# Patient Record
Sex: Female | Born: 1935 | Race: White | Hispanic: No | Marital: Married | Smoking: Never smoker
Health system: Southern US, Community
[De-identification: ages and names within clinical notes are randomized; demographics above are authoritative.]

## PROBLEM LIST (undated history)

## (undated) DIAGNOSIS — G8929 Other chronic pain: Secondary | ICD-10-CM

## (undated) DIAGNOSIS — M5030 Other cervical disc degeneration, unspecified cervical region: Secondary | ICD-10-CM

## (undated) DIAGNOSIS — K219 Gastro-esophageal reflux disease without esophagitis: Secondary | ICD-10-CM

## (undated) DIAGNOSIS — M1990 Unspecified osteoarthritis, unspecified site: Secondary | ICD-10-CM

## (undated) DIAGNOSIS — E785 Hyperlipidemia, unspecified: Secondary | ICD-10-CM

## (undated) DIAGNOSIS — M549 Dorsalgia, unspecified: Secondary | ICD-10-CM

## (undated) DIAGNOSIS — J189 Pneumonia, unspecified organism: Secondary | ICD-10-CM

## (undated) DIAGNOSIS — I499 Cardiac arrhythmia, unspecified: Secondary | ICD-10-CM

## (undated) HISTORY — DX: Hyperlipidemia, unspecified: E78.5

## (undated) HISTORY — DX: Other chronic pain: G89.29

## (undated) HISTORY — PX: TOTAL ABDOMINAL HYSTERECTOMY: SHX209

## (undated) HISTORY — DX: Gastro-esophageal reflux disease without esophagitis: K21.9

## (undated) HISTORY — PX: OTHER SURGICAL HISTORY: SHX169

## (undated) HISTORY — DX: Unspecified osteoarthritis, unspecified site: M19.90

## (undated) HISTORY — PX: TONSILLECTOMY: SUR1361

## (undated) HISTORY — DX: Dorsalgia, unspecified: M54.9

## (undated) HISTORY — DX: Other cervical disc degeneration, unspecified cervical region: M50.30

---
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
## 1997-06-26 ENCOUNTER — Other Ambulatory Visit: Admission: RE | Admit: 1997-06-26 | Discharge: 1997-06-26 | Payer: Self-pay | Admitting: *Deleted

## 1998-08-09 ENCOUNTER — Other Ambulatory Visit: Admission: RE | Admit: 1998-08-09 | Discharge: 1998-08-09 | Payer: Self-pay | Admitting: *Deleted

## 1999-04-06 ENCOUNTER — Encounter: Payer: Self-pay | Admitting: Orthopedic Surgery

## 1999-04-06 ENCOUNTER — Encounter: Admission: RE | Admit: 1999-04-06 | Discharge: 1999-04-06 | Payer: Self-pay | Admitting: Orthopedic Surgery

## 1999-09-15 ENCOUNTER — Other Ambulatory Visit: Admission: RE | Admit: 1999-09-15 | Discharge: 1999-09-15 | Payer: Self-pay | Admitting: *Deleted

## 2000-09-14 ENCOUNTER — Ambulatory Visit (HOSPITAL_COMMUNITY): Admission: RE | Admit: 2000-09-14 | Discharge: 2000-09-14 | Payer: Self-pay | Admitting: Gastroenterology

## 2000-12-17 ENCOUNTER — Other Ambulatory Visit: Admission: RE | Admit: 2000-12-17 | Discharge: 2000-12-17 | Payer: Self-pay | Admitting: *Deleted

## 2002-08-21 ENCOUNTER — Encounter: Admission: RE | Admit: 2002-08-21 | Discharge: 2002-08-21 | Payer: Self-pay

## 2003-05-25 ENCOUNTER — Encounter: Admission: RE | Admit: 2003-05-25 | Discharge: 2003-05-25 | Payer: Self-pay | Admitting: Orthopedic Surgery

## 2005-01-23 HISTORY — PX: OTHER SURGICAL HISTORY: SHX169

## 2006-08-27 ENCOUNTER — Encounter: Admission: RE | Admit: 2006-08-27 | Discharge: 2006-08-27 | Payer: Self-pay | Admitting: Orthopedic Surgery

## 2006-09-11 ENCOUNTER — Encounter: Admission: RE | Admit: 2006-09-11 | Discharge: 2006-09-11 | Payer: Self-pay | Admitting: Orthopedic Surgery

## 2007-08-27 ENCOUNTER — Encounter: Admission: RE | Admit: 2007-08-27 | Discharge: 2007-08-27 | Payer: Self-pay | Admitting: Orthopedic Surgery

## 2007-09-10 ENCOUNTER — Encounter: Admission: RE | Admit: 2007-09-10 | Discharge: 2007-09-10 | Payer: Self-pay | Admitting: Orthopedic Surgery

## 2009-04-29 ENCOUNTER — Encounter: Admission: RE | Admit: 2009-04-29 | Discharge: 2009-04-29 | Payer: Self-pay | Admitting: Internal Medicine

## 2009-07-06 ENCOUNTER — Encounter: Admission: RE | Admit: 2009-07-06 | Discharge: 2009-07-06 | Payer: Self-pay | Admitting: Orthopedic Surgery

## 2009-08-10 ENCOUNTER — Encounter: Admission: RE | Admit: 2009-08-10 | Discharge: 2009-08-10 | Payer: Self-pay | Admitting: Orthopedic Surgery

## 2010-02-14 ENCOUNTER — Encounter: Payer: Self-pay | Admitting: Orthopedic Surgery

## 2010-06-10 NOTE — Procedures (Signed)
[facility]  Patient:    Ana Lopez, Ana Lopez Visit Number: [id number] MRN: [id number]          Service Type: END Location: ENDO Attending Physician:  Louie Bun Proc. Date: 09/14/00 Adm. Date:  09/14/2000   CC:         Heather Roberts, M.D.   Procedure Report  PROCEDURE:  Colonoscopy.  INDICATION FOR PROCEDURE:  Screening colonoscopy in a 77 year old patient with no prior colon screening.  DESCRIPTION OF PROCEDURE:  The patient was placed in the left lateral decubitus position and placed on the pulse monitor with continuous low-flow oxygen delivered by nasal cannula.  She was sedated with 40 mg IV Demerol and 4 mg IV Versed in addition to the 60 mg of Demerol and 6 mg of Versed given for the previous EGD.  The Olympus video colonoscope was inserted into the rectum and advanced to the cecum, confirmed by transillumination at McBurneys point and visualization of the ileocecal valve and appendiceal orifice.  The prep was excellent.  The cecum, ascending, transverse, descending, and sigmoid colon all appeared normal with no masses, polyps, diverticula, or other mucosal abnormalities.  The rectum likewise appeared normal, and retroflex view of the anus revealed some small internal hemorrhoids.  There were also a few rare scattered diverticula in the sigmoid colon.  The colonoscope was then withdrawn, and the patient returned to the recovery room in stable condition. She tolerated the procedure well, and there were no immediate complications.  IMPRESSION:  Internal hemorrhoids, otherwise normal colonoscopy. Attending Physician:  Louie Bun DD:  09/14/00 TD:  09/15/00 Job: [id number]

## 2010-06-10 NOTE — Procedures (Signed)
[facility]  Patient:    Ana Lopez, Ana Lopez Visit Number: [id number] MRN: [id number]          Service Type: END Location: ENDO Attending Physician:  Louie Bun Proc. Date: 09/14/00 Adm. Date:  09/14/2000   CC:         Heather Roberts, M.D.   Procedure Report  PROCEDURE:  Esophagogastroduodenoscopy.  INDICATION FOR PROCEDURE:  Patient undergoing screening colonoscopy who also has a family history of stomach cancer in a first-degree relative.  DESCRIPTION OF PROCEDURE:  The patient was placed in the left lateral decubitus position and placed on the pulse monitor with continuous low-flow oxygen delivered by nasal cannula.  She was sedated with 40 mg IV Demerol and 4 mg IV Versed.   The Olympus video endoscope was advanced under direct vision into the oropharynx and esophagus.  The esophagus was straight and of normal caliber with the squamocolumnar line at 38 cm.  There was no hiatal hernia, ring, stricture, or other abnormality of the GE junction.  The stomach was entered, and a small amount of liquid secretions were suctioned from the fundus.  Retroflexed view of the cardia was unremarkable.  The fundus, body, antrum, and pylorus all appeared normal.  The duodenum was entered, and both the bulb and second portion were well-inspected and appeared to be within normal limits.  The scope was then withdrawn, and the patient returned to the recovery room in stable condition.  She tolerated the procedure well, and there were no immediate complications.  IMPRESSION:  Essentially normal endoscopy.  PLAN:  Will proceed with colonoscopy as scheduled. Attending Physician:  Louie Bun DD:  09/14/00 TD:  09/15/00 Job: [id number]

## 2012-09-18 ENCOUNTER — Ambulatory Visit
Admission: RE | Admit: 2012-09-18 | Discharge: 2012-09-18 | Disposition: A | Discharge status: Home / Self Care | Payer: Medicare Other | Source: Ambulatory Visit | Attending: Internal Medicine | Admitting: Internal Medicine

## 2012-09-18 ENCOUNTER — Other Ambulatory Visit: Payer: Self-pay | Admitting: Internal Medicine

## 2012-09-18 DIAGNOSIS — M79602 Pain in left arm: Secondary | ICD-10-CM

## 2012-09-18 DIAGNOSIS — R52 Pain, unspecified: Secondary | ICD-10-CM

## 2012-09-18 IMAGING — CR DG HUMERUS 2V *L*
2 series · 2 of 2 positions shown · non-contrast
Comparison: None

CLINICAL DATA: Posterior pain at left shoulder and humerus
radiating to left elbow for 1 month, no injury, tenderness

LEFT HUMERUS - 2+ VIEW

[view not recorded (1 of 2)]
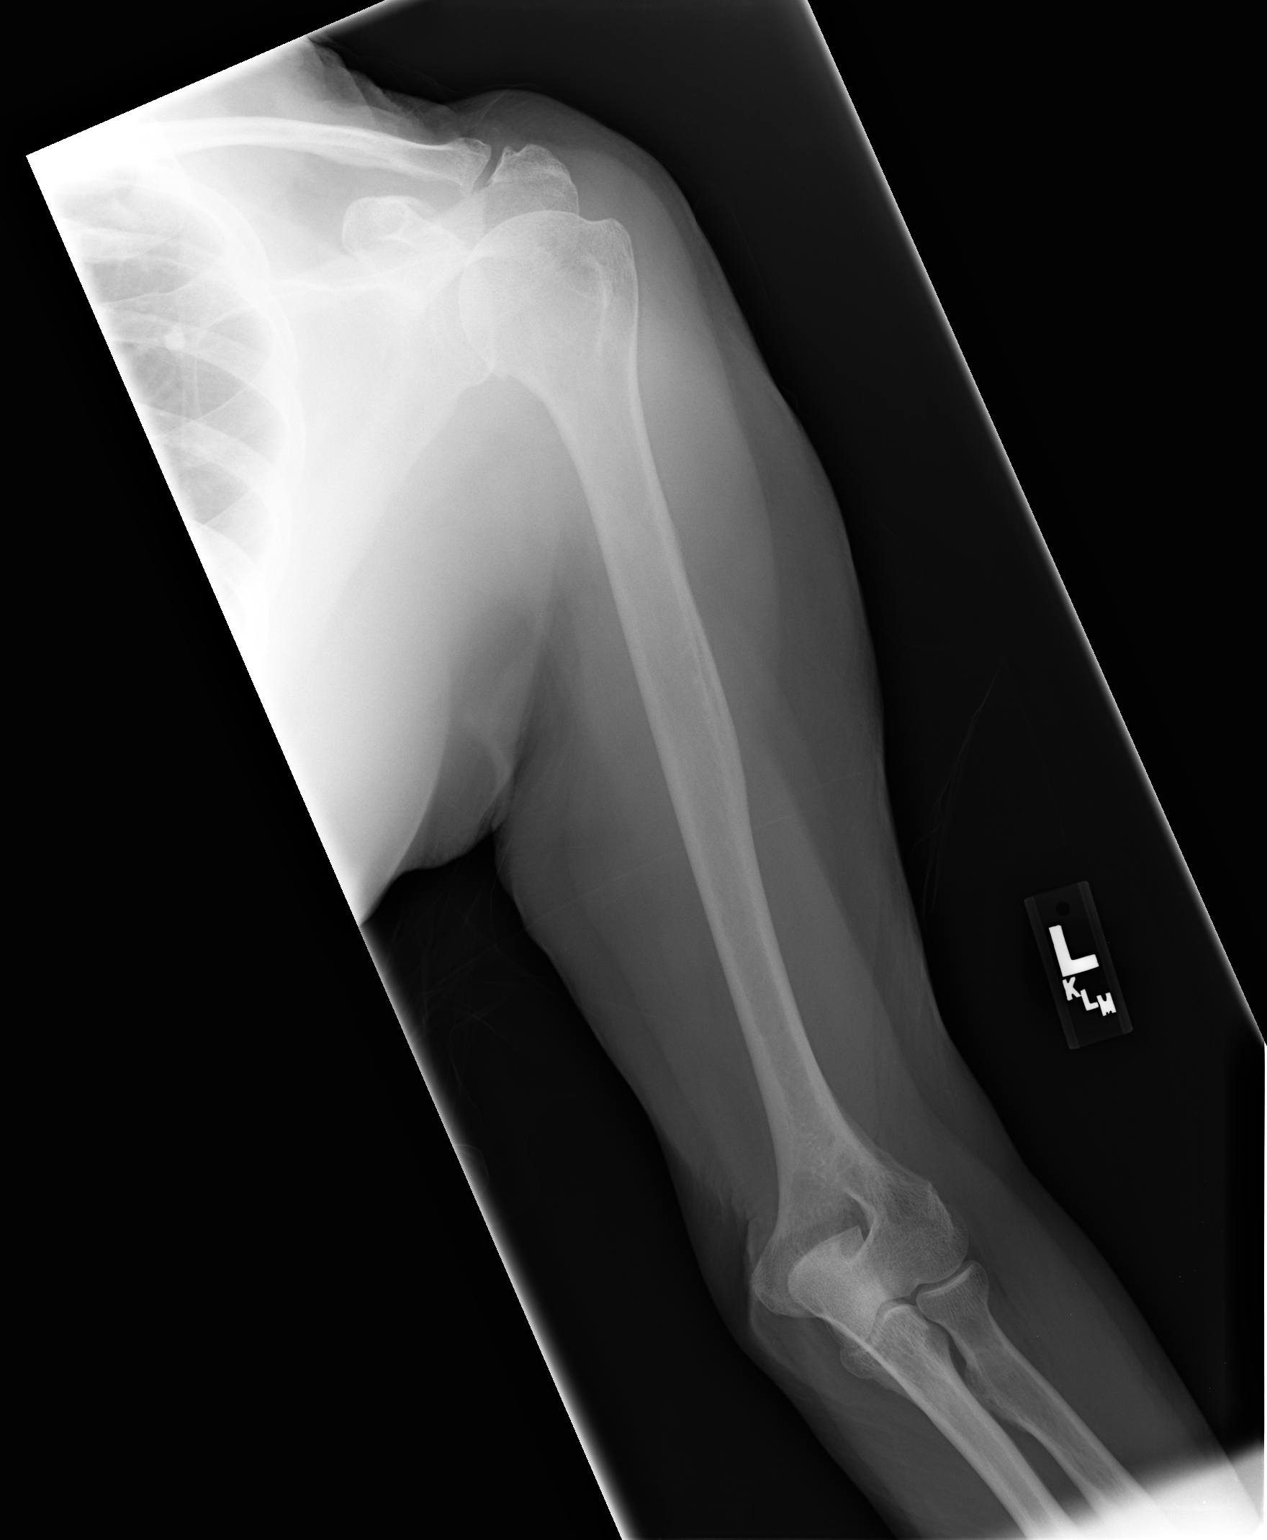

[view not recorded (2 of 2)]
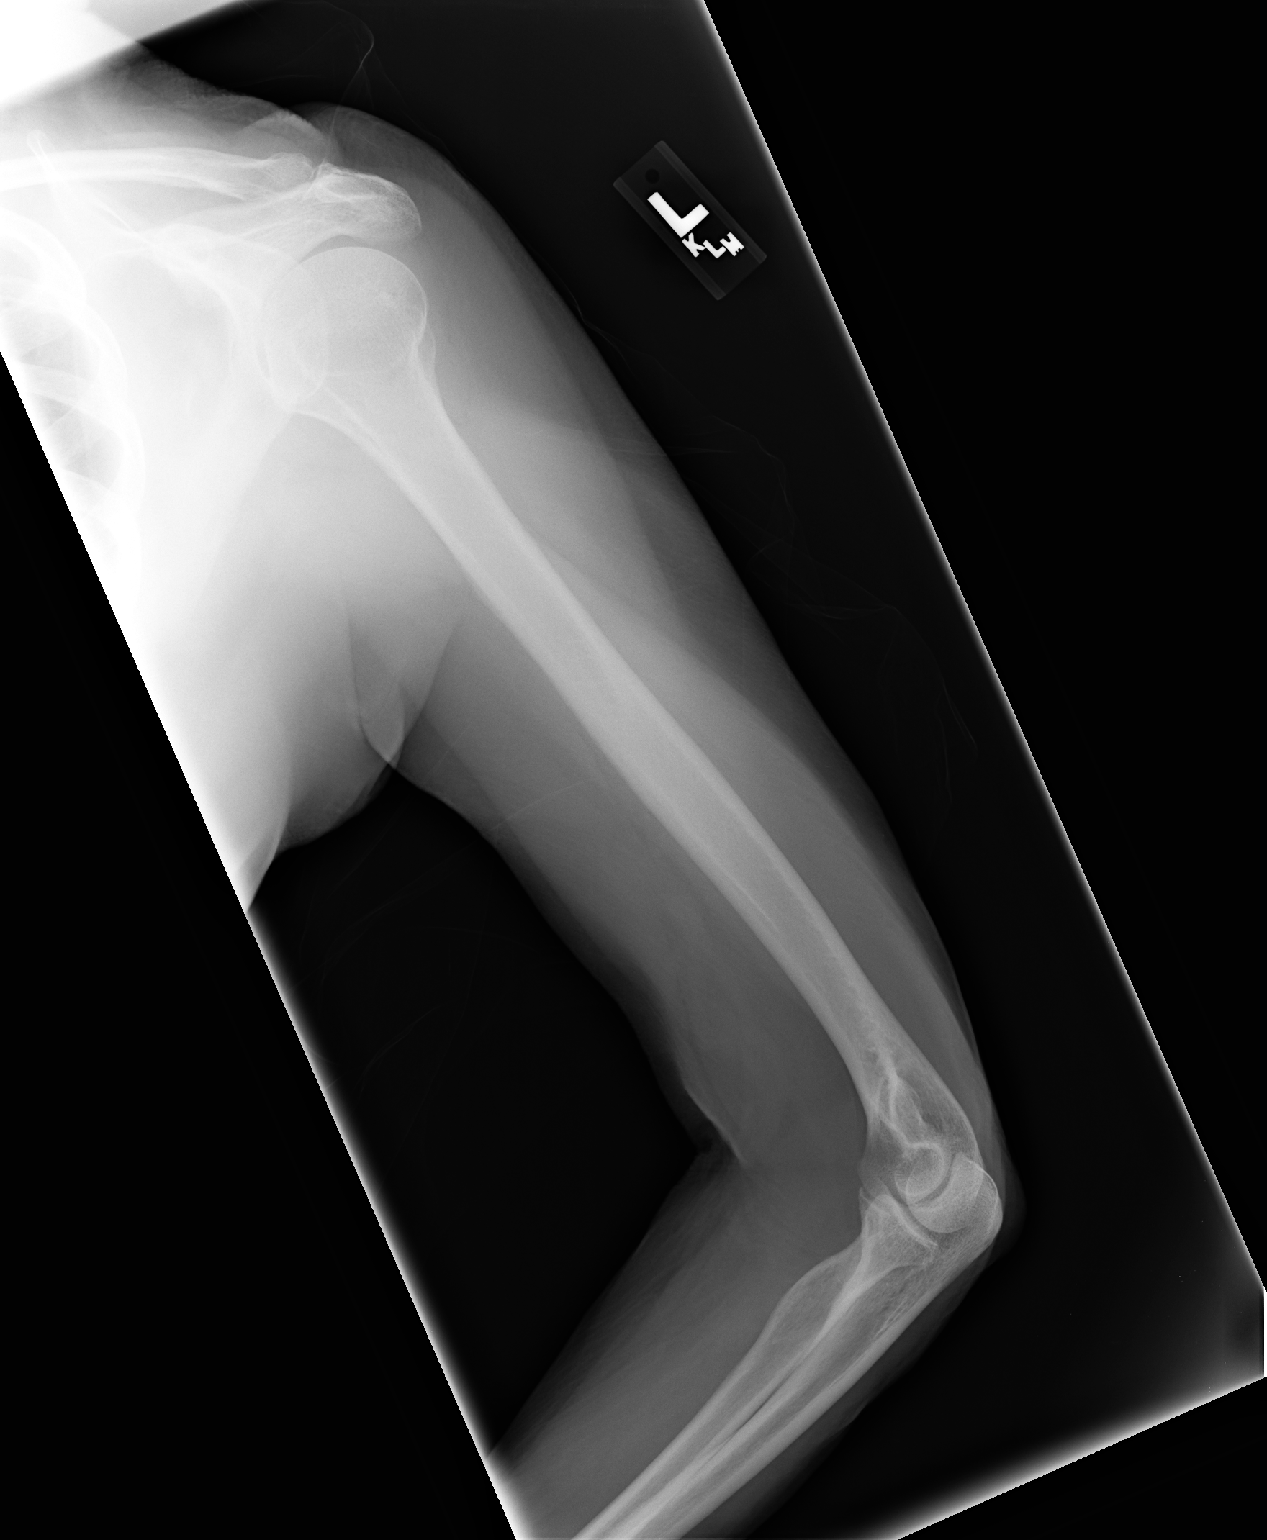

[2 of 2 positions shown; findings below may reference images not displayed]

FINDINGS: Osseous demineralization.
Elbow and shoulder joint alignments grossly normal.
No acute fracture, dislocation, or bone destruction.
Soft tissues unremarkable.
Minimal degenerative changes left AC joint.
IMPRESSION: Minimal degenerative changes left AC joint.
No acute left humeral abnormalities.

## 2014-05-01 ENCOUNTER — Other Ambulatory Visit (HOSPITAL_COMMUNITY): Payer: Self-pay | Admitting: Internal Medicine

## 2014-05-01 DIAGNOSIS — I4891 Unspecified atrial fibrillation: Secondary | ICD-10-CM

## 2014-05-04 ENCOUNTER — Ambulatory Visit (HOSPITAL_COMMUNITY)
Admission: RE | Admit: 2014-05-04 | Discharge: 2014-05-04 | Disposition: A | Discharge status: Home / Self Care | Payer: Medicare Other | Source: Ambulatory Visit | Attending: Cardiology | Admitting: Cardiology

## 2014-05-04 DIAGNOSIS — I4891 Unspecified atrial fibrillation: Secondary | ICD-10-CM | POA: Diagnosis present

## 2014-05-04 DIAGNOSIS — I081 Rheumatic disorders of both mitral and tricuspid valves: Secondary | ICD-10-CM | POA: Insufficient documentation

## 2014-05-04 NOTE — Progress Notes (Signed)
2D Echo Performed 05/04/2014    Garison Genova, RCS  

## 2014-05-14 ENCOUNTER — Telehealth: Payer: Self-pay | Admitting: Cardiovascular Disease

## 2014-05-14 NOTE — Telephone Encounter (Signed)
Received records from [facility] for appointment with Dr Tresa EndoKelly on 6/0/16.  [facility] Dr Landry DykeKelly's schedule on 6/0/16. lp

## 2014-05-18 ENCOUNTER — Encounter: Payer: Self-pay | Admitting: *Deleted

## 2014-05-18 ENCOUNTER — Encounter: Payer: Self-pay | Admitting: Internal Medicine

## 2014-05-18 ENCOUNTER — Ambulatory Visit (INDEPENDENT_AMBULATORY_CARE_PROVIDER_SITE_OTHER): Payer: Medicare Other | Admitting: Internal Medicine

## 2014-05-18 ENCOUNTER — Telehealth: Payer: Self-pay

## 2014-05-18 VITALS — BP 120/70 | HR 116 | Ht 64.5 in | Wt 156.0 lb

## 2014-05-18 DIAGNOSIS — I4892 Unspecified atrial flutter: Secondary | ICD-10-CM | POA: Insufficient documentation

## 2014-05-18 DIAGNOSIS — I483 Typical atrial flutter: Secondary | ICD-10-CM

## 2014-05-18 LAB — CBC WITH DIFFERENTIAL/PLATELET
BASOS ABS: 0 10*3/uL (ref 0.0–0.1)
BASOS PCT: 0.5 % (ref 0.0–3.0)
EOS ABS: 0.2 10*3/uL (ref 0.0–0.7)
EOS PCT: 2.5 % (ref 0.0–5.0)
HCT: 40.5 % (ref 36.0–46.0)
HEMOGLOBIN: 13.6 g/dL (ref 12.0–15.0)
Lymphocytes Relative: 17.3 % (ref 12.0–46.0)
Lymphs Abs: 1.2 10*3/uL (ref 0.7–4.0)
MCHC: 33.7 g/dL (ref 30.0–36.0)
MCV: 91.6 fl (ref 78.0–100.0)
Monocytes Absolute: 0.6 10*3/uL (ref 0.1–1.0)
Monocytes Relative: 8.1 % (ref 3.0–12.0)
NEUTROS PCT: 71.6 % (ref 43.0–77.0)
Neutro Abs: 5.2 10*3/uL (ref 1.4–7.7)
PLATELETS: 368 10*3/uL (ref 150.0–400.0)
RBC: 4.42 Mil/uL (ref 3.87–5.11)
RDW: 14.3 % (ref 11.5–15.5)
WBC: 7.2 10*3/uL (ref 4.0–10.5)

## 2014-05-18 LAB — BASIC METABOLIC PANEL
BUN: 18 mg/dL (ref 6–23)
CALCIUM: 9.4 mg/dL (ref 8.4–10.5)
CO2: 26 meq/L (ref 19–32)
Chloride: 105 mEq/L (ref 96–112)
Creatinine, Ser: 0.92 mg/dL (ref 0.40–1.20)
GFR: 61.94 mL/min (ref >60.00)
Glucose, Bld: 95 mg/dL (ref 70–99)
Potassium: 4.1 mEq/L (ref 3.5–5.1)
SODIUM: 137 meq/L (ref 135–145)

## 2014-05-18 MED ORDER — RIVAROXABAN 20 MG PO TABS: 20.0000 mg | ORAL_TABLET | Freq: Every day | ORAL | Status: DC

## 2014-05-18 NOTE — Progress Notes (Signed)
 Electrophysiology Office Note   Date:  05/18/2014   ID:  Kambrea Flores, DOB 04/18/1929, MRN [id number]  PCP:  MOREIRA,ROY, MD  Cardiologist:  Dr Ganji Primary Electrophysiologist: Landrey Mahurin, MD    Chief Complaint  Patient presents with  . Atrial Flutter     History of Present Illness: Ana Lopez is a 81 y.o. female who presents today for electrophysiology evaluation.   The patient has recently been diagnosed with atrial flutter.  She reports that last fall while walking in the park, she became very dizzy.    She has had intermittent "flutters" which she attributed to caffeine.  3 weeks ago, she became dizzy and felt that she may pass out.  She went to her PCP 04/29/14 and was found to have atrial flutter.  She reports poor energy for 3 weeks.  She reports decreased exercise tolerance.  Previously, she has been very active and continues to ride a bike.  She was placed on xarelto and diltiazem by Dr Moreira.  She was referred to Dr Ganji and was placed on metoprolol.   She feels better in the morning, but has worsening fatigue as the day goes on.  Today, she denies symptoms of  chest pain,  orthopnea, PND, lower extremity edema, claudication,  syncope, bleeding, or neurologic sequela. The patient is tolerating medications without difficulties and is otherwise without complaint today.    Past Medical History  Diagnosis Date  . DDD (degenerative disc disease), cervical   . GERD (gastroesophageal reflux disease)   . Hyperlipidemia   . DJD (degenerative joint disease)   . Chronic back pain    Past Surgical History  Procedure Laterality Date  . Tonsillectomy    . Breast mass excision  1954    benign  . Total abdominal hysterectomy    . Arthroscopy    . Right total hip arthroplasty       Current Outpatient Prescriptions  Medication Sig Dispense Refill  . diltiazem (CARDIZEM SR) 60 MG 12 hr capsule Take one capsule in the morning and two capsules in the evening  0  .  fenofibrate micronized (LOFIBRA) 134 MG capsule Take 1 capsule by mouth daily.    . metoprolol succinate (TOPROL-XL) 25 MG 24 hr tablet Take 25 mg by mouth daily.  0  . raloxifene (EVISTA) 60 MG tablet Take 1 tablet by mouth daily.    . Rivaroxaban (XARELTO) 15 MG TABS tablet Take 15 mg by mouth 2 (two) times daily with a meal.    . simvastatin (ZOCOR) 40 MG tablet Take 1 tablet by mouth daily.     No current facility-administered medications for this visit.    Allergies:   Sulfa antibiotics   Social History:  The patient  reports that she has never smoked. She has never used smokeless tobacco. She reports that she drinks alcohol. She reports that she does not use illicit drugs.   Family History:  The patient's  family history includes Sudden death in her mother.    ROS:  Please see the history of present illness.   All other systems are reviewed and negative.    PHYSICAL EXAM: VS:  BP 120/70 mmHg  Pulse 116  Ht 5' 4.5" (1.638 m)  Wt 156 lb (70.761 kg)  BMI 26.37 kg/m2 , BMI Body mass index is 26.37 kg/(m^2). GEN: Well nourished, well developed, in no acute distress HEENT: normal Neck: no JVD, carotid bruits, or masses Cardiac: iRRR; no murmurs, rubs, or gallops,no edema  Respiratory:    clear to auscultation bilaterally, normal work of breathing GI: soft, nontender, nondistended, + BS MS: no deformity or atrophy Skin: warm and dry  Neuro:  Strength and sensation are intact Psych: euthymic mood, full affect  EKG:  EKG is ordered today. The ekg ordered today shows typical appearing atrial flutter with 2:1 conduction, LAHB   Recent Labs: No results found for requested labs within last 365 days.    Lipid Panel  No results found for: CHOL, TRIG, HDL, CHOLHDL, VLDL, LDLCALC, LDLDIRECT   Wt Readings from Last 3 Encounters:  05/18/14 156 lb (70.761 kg)      Other studies Reviewed: Additional studies/ records that were reviewed today include: 05/04/14 echo reveals preserved  EF, mild MR, mild LA dilitation   ASSESSMENT AND PLAN:  1.  Typical appearing atrial flutter The patient has symptomatic atrial flutter.  She has been adequately anticoagulated for several weeks.  She has been placed on diltiazem and metoprolol but continues to have symptoms. Therapeutic strategies for atrial flutter including medicine and ablation were discussed in detail with the patient today. Risk, benefits, and alternatives to EP study and radiofrequency ablation were also discussed in detail today. These risks include but are not limited to stroke, bleeding, vascular damage, tamponade, perforation, damage to the heart and other structures, AV block requiring pacemaker, worsening renal function, and death. The patient understands these risk and wishes to proceed.  We will therefore proceed with catheter ablation at the next available time after 3 weeks of anticoagulation. She was on xarelto15mg BID.  I will change to 20mg daily daily and obtain cbc and bmet today chads2vasc score is at least 3.  Dr Ganjis office note is reviewed  Current medicines are reviewed at length with the patient today.   The patient does not have concerns regarding her medicines.  The following changes were made today:  none  Signed, Seila Liston, MD  05/18/2014 9:20 AM     [facility] [address] [place] [address] [phone] (office) [phone] (fax)  

## 2014-05-18 NOTE — Telephone Encounter (Signed)
Prior auth obtained for Xarelto 20mg  daily. Ref #[id number] good till 05/18/2015. Left message on patient's home voicemail.

## 2014-05-18 NOTE — Addendum Note (Signed)
Addended by: Dennis BastLANIER, Estle Sabella F on: 05/18/2014 09:42 AM   Modules accepted: Orders

## 2014-05-18 NOTE — Patient Instructions (Signed)
Medication Instructions:  Your physician has recommended you make the following change in your medication:  1) Change your Xarelto to 20 mg daily   Labwork: Your physician recommends that you return for lab work today: BMP/CBC   Testing/Procedures: Your physician has recommended that you have an ablation. Catheter ablation is a medical procedure used to treat some cardiac arrhythmias (irregular heartbeats). During catheter ablation, a long, thin, flexible tube is put into a blood vessel in your groin (upper thigh), or neck. This tube is called an ablation catheter. It is then guided to your heart through the blood vessel. Radio frequency waves destroy small areas of heart tissue where abnormal heartbeats may cause an arrhythmia to start. Please see the instruction sheet given to you today.  See instruction sheet for procedure    Follow-Up:  Will schedule after ablation  Any Other Special Instructions Will Be Listed Below (If Applicable).  Cardiac Ablation Cardiac ablation is a procedure to disable a small amount of heart tissue in very specific places. The heart has many electrical connections. Sometimes these connections are abnormal and can cause the heart to beat very fast or irregularly. By disabling some of the problem areas, heart rhythm can be improved or made normal. Ablation is done for people who:   Have Wolff-Parkinson-White syndrome.   Have other fast heart rhythms (tachycardia).   Have taken medicines for an abnormal heart rhythm (arrhythmia) that resulted in:   No success.   Side effects.   May have a high-risk heartbeat that could result in death.  LET [facility] PROVIDER KNOW ABOUT:   Any allergies you have or any previous reactions you have had to X-ray dye, food (such as seafood), medicine, or tape.   All medicines you are taking, including vitamins, herbs, eye drops, creams, and over-the-counter medicines.   Previous problems you or members  of your family have had with the use of anesthetics.   Any blood disorders you have.   Previous surgeries or procedures (such as a kidney transplant) you have had.   Medical conditions you have (such as kidney failure).  RISKS AND COMPLICATIONS Generally, cardiac ablation is a safe procedure. However, problems can occur and include:   Increased risk of cancer. Depending on how long it takes to do the ablation, the dose of radiation can be high.  Bruising and bleeding where a thin, flexible tube (catheter) was inserted during the procedure.   Bleeding into the chest, especially into the sac that surrounds the heart (serious).  Need for a permanent pacemaker if the normal electrical system is damaged.   The procedure may not be fully effective, and this may not be recognized for months. Repeat ablation procedures are sometimes required. BEFORE THE PROCEDURE   Follow any instructions from your health care provider regarding eating and drinking before the procedure.   Take your medicines as directed at regular times with water, unless instructed otherwise by your health care provider. If you are taking diabetes medicine, including insulin, ask how you are to take it and if there are any special instructions you should follow. It is common to adjust insulin dosing the day of the ablation.  PROCEDURE  An ablation is usually performed in a catheterization laboratory with the guidance of fluoroscopy. Fluoroscopy is a type of X-ray that helps your health care provider see images of your heart during the procedure.   An ablation is a minimally invasive procedure. This means a small cut (incision) is made in  either your neck or groin. Your health care provider will decide where to make the incision based on your medical history and physical exam.  An IV tube will be started before the procedure begins. You will be given an anesthetic or medicine to help you relax (sedative).  The skin  on your neck or groin will be numbed. A needle will be inserted into a large vein in your neck or groin and catheters will be threaded to your heart.  A special dye that shows up on fluoroscopy pictures may be injected through the catheter. The dye helps your health care provider see the area of the heart that needs treatment.  The catheter has electrodes on the tip. When the area of heart tissue that is causing the arrhythmia is found, the catheter tip will send an electrical current to the area and "scar" the tissue. Three types of energy can be used to ablate the heart tissue:   Heat (radiofrequency energy).   Laser energy.   Extreme cold (cryoablation).   When the area of the heart has been ablated, the catheter will be taken out. Pressure will be held on the insertion site. This will help the insertion site clot and keep it from bleeding. A bandage will be placed on the insertion site.  AFTER THE PROCEDURE   After the procedure, you will be taken to a recovery area where your vital signs (blood pressure, heart rate, and breathing) will be monitored. The insertion site will also be monitored for bleeding.   You will need to lie still for 4-6 hours. This is to ensure you do not bleed from the catheter insertion site.  Document Released: 05/28/2008 Document Revised: 05/26/2013 Document Reviewed: 06/03/2012 [facility] Patient Information 2015 [place], [place]. This information is not intended to replace advice given to you by your health care provider. Make sure you discuss any questions you have with your health care provider.

## 2014-05-21 ENCOUNTER — Ambulatory Visit (HOSPITAL_COMMUNITY): Payer: Medicare Other | Admitting: Certified Registered Nurse Anesthetist

## 2014-05-21 ENCOUNTER — Encounter (HOSPITAL_COMMUNITY): Payer: Self-pay | Admitting: Certified Registered Nurse Anesthetist

## 2014-05-21 ENCOUNTER — Ambulatory Visit (HOSPITAL_COMMUNITY)
Admission: RE | Admit: 2014-05-21 | Discharge: 2014-05-21 | Disposition: A | Discharge status: Home / Self Care | Payer: Medicare Other | Source: Ambulatory Visit | Attending: Internal Medicine | Admitting: Internal Medicine

## 2014-05-21 ENCOUNTER — Encounter (HOSPITAL_COMMUNITY)
Admission: RE | Disposition: A | Discharge status: Home / Self Care | Payer: Self-pay | Source: Ambulatory Visit | Attending: Internal Medicine

## 2014-05-21 DIAGNOSIS — Z79899 Other long term (current) drug therapy: Secondary | ICD-10-CM | POA: Insufficient documentation

## 2014-05-21 DIAGNOSIS — I4892 Unspecified atrial flutter: Secondary | ICD-10-CM | POA: Diagnosis present

## 2014-05-21 DIAGNOSIS — Z96641 Presence of right artificial hip joint: Secondary | ICD-10-CM | POA: Insufficient documentation

## 2014-05-21 DIAGNOSIS — I483 Typical atrial flutter: Secondary | ICD-10-CM | POA: Insufficient documentation

## 2014-05-21 DIAGNOSIS — Z7901 Long term (current) use of anticoagulants: Secondary | ICD-10-CM | POA: Diagnosis not present

## 2014-05-21 DIAGNOSIS — K219 Gastro-esophageal reflux disease without esophagitis: Secondary | ICD-10-CM | POA: Diagnosis not present

## 2014-05-21 DIAGNOSIS — M5030 Other cervical disc degeneration, unspecified cervical region: Secondary | ICD-10-CM | POA: Insufficient documentation

## 2014-05-21 DIAGNOSIS — G8929 Other chronic pain: Secondary | ICD-10-CM | POA: Diagnosis not present

## 2014-05-21 DIAGNOSIS — M1990 Unspecified osteoarthritis, unspecified site: Secondary | ICD-10-CM | POA: Diagnosis not present

## 2014-05-21 DIAGNOSIS — M549 Dorsalgia, unspecified: Secondary | ICD-10-CM | POA: Insufficient documentation

## 2014-05-21 DIAGNOSIS — E785 Hyperlipidemia, unspecified: Secondary | ICD-10-CM | POA: Insufficient documentation

## 2014-05-21 DIAGNOSIS — Z90710 Acquired absence of both cervix and uterus: Secondary | ICD-10-CM | POA: Diagnosis not present

## 2014-05-21 HISTORY — PX: ATRIAL FLUTTER ABLATION: SHX5733

## 2014-05-21 SURGERY — ATRIAL FLUTTER ABLATION
Anesthesia: General

## 2014-05-21 MED ORDER — ONDANSETRON HCL 4 MG/2ML IJ SOLN
INTRAMUSCULAR | Status: DC | PRN
  Administered 2014-05-21: 4 mg via INTRAVENOUS

## 2014-05-21 MED ORDER — SODIUM CHLORIDE 0.9 % IV SOLN
INTRAVENOUS | Status: AC
  Administered 2014-05-21: 250 mL via INTRAVENOUS

## 2014-05-21 MED ORDER — ACETAMINOPHEN 325 MG PO TABS: 650.0000 mg | ORAL_TABLET | ORAL | Status: DC | PRN

## 2014-05-21 MED ORDER — RIVAROXABAN 20 MG PO TABS: 20.0000 mg | ORAL_TABLET | Freq: Every day | ORAL | Status: DC

## 2014-05-21 MED ORDER — ONDANSETRON HCL 4 MG/2ML IJ SOLN: 4.0000 mg | Freq: Four times a day (QID) | INTRAMUSCULAR | Status: DC | PRN

## 2014-05-21 MED ORDER — SODIUM CHLORIDE 0.9 % IJ SOLN: 3.0000 mL | Freq: Two times a day (BID) | INTRAMUSCULAR | Status: DC

## 2014-05-21 MED ORDER — FENTANYL CITRATE (PF) 100 MCG/2ML IJ SOLN
INTRAMUSCULAR | Status: DC | PRN
  Administered 2014-05-21: 25 ug via INTRAVENOUS
  Administered 2014-05-21: 50 ug via INTRAVENOUS
  Administered 2014-05-21: 25 ug via INTRAVENOUS

## 2014-05-21 MED ORDER — SODIUM CHLORIDE 0.9 % IV SOLN: 250.0000 mL | INTRAVENOUS | Status: DC | PRN

## 2014-05-21 MED ORDER — RALOXIFENE HCL 60 MG PO TABS: 60.0000 mg | ORAL_TABLET | Freq: Every day | ORAL | Status: DC

## 2014-05-21 MED ORDER — SODIUM CHLORIDE 0.9 % IJ SOLN: 3.0000 mL | INTRAMUSCULAR | Status: DC | PRN

## 2014-05-21 MED ORDER — HYDROCODONE-ACETAMINOPHEN 5-325 MG PO TABS: 1.0000 | ORAL_TABLET | ORAL | Status: DC | PRN

## 2014-05-21 MED ORDER — LIDOCAINE HCL (CARDIAC) 20 MG/ML IV SOLN
INTRAVENOUS | Status: DC | PRN
  Administered 2014-05-21: 60 mg via INTRAVENOUS

## 2014-05-21 MED ORDER — PROPOFOL 10 MG/ML IV BOLUS
INTRAVENOUS | Status: DC | PRN
  Administered 2014-05-21: 120 mg via INTRAVENOUS

## 2014-05-21 MED ORDER — BUPIVACAINE HCL (PF) 0.25 % IJ SOLN
INTRAMUSCULAR | Status: AC
  Filled 2014-05-21: qty 30

## 2014-05-21 MED ORDER — PHENYLEPHRINE HCL 10 MG/ML IJ SOLN
INTRAMUSCULAR | Status: DC | PRN
  Administered 2014-05-21: 80 ug via INTRAVENOUS
  Administered 2014-05-21: 120 ug via INTRAVENOUS
  Administered 2014-05-21 (×2): 80 ug via INTRAVENOUS
  Administered 2014-05-21: 120 ug via INTRAVENOUS

## 2014-05-21 MED ORDER — LACTATED RINGERS IV SOLN
INTRAVENOUS | Status: DC | PRN
  Administered 2014-05-21: 07:00:00 via INTRAVENOUS

## 2014-05-21 NOTE — Discharge Instructions (Signed)
No driving for 4 days. No lifting over 5 lbs for 1 week. No sexual activity for 1 week.  Keep procedure site clean & dry. If you notice increased pain, swelling, bleeding or pus, call/return!  You may shower, but no soaking baths/hot tubs/pools for 1 week.  ° ° °

## 2014-05-21 NOTE — Anesthesia Procedure Notes (Signed)
Procedure Name: LMA Insertion Date/Time: 05/21/2014 7:41 AM Performed by: Adonis HousekeeperNGELL, Tramayne Sebesta M Pre-anesthesia Checklist: Patient identified, Emergency Drugs available, Suction available and Patient being monitored Patient Re-evaluated:Patient Re-evaluated prior to inductionOxygen Delivery Method: Circle system utilized Preoxygenation: Pre-oxygenation with 100% oxygen Intubation Type: IV induction Ventilation: Mask ventilation without difficulty LMA: LMA inserted LMA Size: 4.0 Number of attempts: 1 Placement Confirmation: positive ETCO2 and breath sounds checked- equal and bilateral Tube secured with: Tape Dental Injury: Teeth and Oropharynx as per pre-operative assessment

## 2014-05-21 NOTE — Progress Notes (Signed)
[facility]  [facility]    250cc NS bolus infusing per order Dr Johney FrameAllred. Will wait for improvement in BP (85/50) before transport.

## 2014-05-21 NOTE — Progress Notes (Signed)
Pt doing well post flutter ablation Discontinue Diltiazem and Metoprolol Continue Xarelto for 4 weeks post ablation Follow up with Dr Johney FrameAllred in 4 weeks.  Discharge instructions/restrictions reviewed with patient.  Ok to discharge home after bedrest complete.  Please call with questions.  Gypsy BalsamAmber Seiler, NP 05/21/2014 2:15 PM  Hillis RangeJames Jojuan Champney MD, [facility] 05/21/2014 2:19 PM

## 2014-05-21 NOTE — Anesthesia Postprocedure Evaluation (Signed)
Anesthesia Post Note  Patient: Ana Biblelizabeth Lopez  Procedure(s) Performed: Procedure(s) (LRB): ATRIAL FLUTTER ABLATION (N/A)  Anesthesia type: General  Patient location: PACU  Post pain: Pain level controlled and Adequate analgesia  Post assessment: Post-op Vital signs reviewed, Patient's Cardiovascular Status Stable, Respiratory Function Stable, Patent Airway and Pain level controlled  Last Vitals:  Filed Vitals:   05/21/14 1208  BP: 89/53  Pulse: 62  Temp:   Resp: 16    Post vital signs: Reviewed and stable  Level of consciousness: awake, alert  and oriented  Complications: No apparent anesthesia complications

## 2014-05-21 NOTE — H&P (View-Only) (Signed)
Electrophysiology Office Note   Date:  05/18/2014   ID:  Ana Biblelizabeth Riffel, DOB 12-08-1929, MRN [id number]  PCP:  Ralene OkMOREIRA,ROY, MD  Cardiologist:  Dr Jacinto HalimGanji Primary Electrophysiologist: Hillis RangeJames Tanish Prien, MD    Chief Complaint  Patient presents with  . Atrial Flutter     History of Present Illness: Ana Lopez is a 81 y.o. female who presents today for electrophysiology evaluation.   The patient has recently been diagnosed with atrial flutter.  She reports that last fall while walking in the park, she became very dizzy.    She has had intermittent "flutters" which she attributed to caffeine.  3 weeks ago, she became dizzy and felt that she may pass out.  She went to her PCP 04/29/14 and was found to have atrial flutter.  She reports poor energy for 3 weeks.  She reports decreased exercise tolerance.  Previously, she has been very active and continues to ride a bike.  She was placed on xarelto and diltiazem by Dr Ludwig ClarksMoreira.  She was referred to Dr Jacinto HalimGanji and was placed on metoprolol.   She feels better in the morning, but has worsening fatigue as the day goes on.  Today, she denies symptoms of  chest pain,  orthopnea, PND, lower extremity edema, claudication,  syncope, bleeding, or neurologic sequela. The patient is tolerating medications without difficulties and is otherwise without complaint today.    Past Medical History  Diagnosis Date  . DDD (degenerative disc disease), cervical   . GERD (gastroesophageal reflux disease)   . Hyperlipidemia   . DJD (degenerative joint disease)   . Chronic back pain    Past Surgical History  Procedure Laterality Date  . Tonsillectomy    . Breast mass excision  1954    benign  . Total abdominal hysterectomy    . Arthroscopy    . Right total hip arthroplasty       Current Outpatient Prescriptions  Medication Sig Dispense Refill  . diltiazem (CARDIZEM SR) 60 MG 12 hr capsule Take one capsule in the morning and two capsules in the evening  0  .  fenofibrate micronized (LOFIBRA) 134 MG capsule Take 1 capsule by mouth daily.    . metoprolol succinate (TOPROL-XL) 25 MG 24 hr tablet Take 25 mg by mouth daily.  0  . raloxifene (EVISTA) 60 MG tablet Take 1 tablet by mouth daily.    . Rivaroxaban (XARELTO) 15 MG TABS tablet Take 15 mg by mouth 2 (two) times daily with a meal.    . simvastatin (ZOCOR) 40 MG tablet Take 1 tablet by mouth daily.     No current facility-administered medications for this visit.    Allergies:   Sulfa antibiotics   Social History:  The patient  reports that she has never smoked. She has never used smokeless tobacco. She reports that she drinks alcohol. She reports that she does not use illicit drugs.   Family History:  The patient's  family history includes Sudden death in her mother.    ROS:  Please see the history of present illness.   All other systems are reviewed and negative.    PHYSICAL EXAM: VS:  BP 120/70 mmHg  Pulse 116  Ht 5' 4.5" (1.638 m)  Wt 156 lb (70.761 kg)  BMI 26.37 kg/m2 , BMI Body mass index is 26.37 kg/(m^2). GEN: Well nourished, well developed, in no acute distress HEENT: normal Neck: no JVD, carotid bruits, or masses Cardiac: iRRR; no murmurs, rubs, or gallops,no edema  Respiratory:  clear to auscultation bilaterally, normal work of breathing GI: soft, nontender, nondistended, + BS MS: no deformity or atrophy Skin: warm and dry  Neuro:  Strength and sensation are intact Psych: euthymic mood, full affect  EKG:  EKG is ordered today. The ekg ordered today shows typical appearing atrial flutter with 2:1 conduction, LAHB   Recent Labs: No results found for requested labs within last 365 days.    Lipid Panel  No results found for: CHOL, TRIG, HDL, CHOLHDL, VLDL, LDLCALC, LDLDIRECT   Wt Readings from Last 3 Encounters:  05/18/14 156 lb (70.761 kg)      Other studies Reviewed: Additional studies/ records that were reviewed today include: 05/04/14 echo reveals preserved  EF, mild MR, mild LA dilitation   ASSESSMENT AND PLAN:  1.  Typical appearing atrial flutter The patient has symptomatic atrial flutter.  She has been adequately anticoagulated for several weeks.  She has been placed on diltiazem and metoprolol but continues to have symptoms. Therapeutic strategies for atrial flutter including medicine and ablation were discussed in detail with the patient today. Risk, benefits, and alternatives to EP study and radiofrequency ablation were also discussed in detail today. These risks include but are not limited to stroke, bleeding, vascular damage, tamponade, perforation, damage to the heart and other structures, AV block requiring pacemaker, worsening renal function, and death. The patient understands these risk and wishes to proceed.  We will therefore proceed with catheter ablation at the next available time after 3 weeks of anticoagulation. She was on xarelto15mg  BID.  I will change to  daily daily and obtain cbc and bmet today chads2vasc score is at least 3.  Dr Jerre Simon office note is reviewed  Current medicines are reviewed at length with the patient today.   The patient does not have concerns regarding her medicines.  The following changes were made today:  none  Signed, Hillis Range, MD  05/18/2014 9:20 AM     [facility] [address] [place] [address] [phone] (office) [phone] (fax)

## 2014-05-21 NOTE — Anesthesia Preprocedure Evaluation (Addendum)
Anesthesia Evaluation  Patient identified by MRN, date of birth, ID band Patient awake    Reviewed: Allergy & Precautions, NPO status , Patient's Chart, lab work & pertinent test results  Airway Mallampati: II   Neck ROM: full    Dental   Pulmonary neg pulmonary ROS,  breath sounds clear to auscultation        Cardiovascular + dysrhythmias Atrial Fibrillation Rhythm:irregular Rate:Normal     Neuro/Psych    GI/Hepatic GERD-  ,  Endo/Other    Renal/GU      Musculoskeletal  (+) Arthritis -,   Abdominal   Peds  Hematology   Anesthesia Other Findings   Reproductive/Obstetrics                             Anesthesia Physical Anesthesia Plan  ASA: II  Anesthesia Plan: General   Post-op Pain Management:    Induction: Intravenous  Airway Management Planned: LMA  Additional Equipment:   Intra-op Plan:   Post-operative Plan:   Informed Consent: I have reviewed the patients History and Physical, chart, labs and discussed the procedure including the risks, benefits and alternatives for the proposed anesthesia with the patient or authorized representative who has indicated his/her understanding and acceptance.     Plan Discussed with: CRNA, Anesthesiologist and Surgeon  Anesthesia Plan Comments:        Anesthesia Quick Evaluation

## 2014-05-21 NOTE — Op Note (Signed)
PREPROCEDURE DIAGNOSIS: Typical-appearing atrial flutter.   POSTPROCEDURE DIAGNOSIS: Isthmus-dependent right atrial flutter.   PROCEDURES:  1. Comprehensive EP study.  2. Coronary sinus pacing and recording.  3. Mapping of SVT.  4. Ablation of SVT.   INTRODUCTION:  Ana Lopez is a 81 y.o. female with a history of symptomatic typical-appearing atrial flutter. She presents today for EP study and radiofrequency ablation.   DESCRIPTION OF THE PROCEDURE: Informed written consent was obtained, and the patient was brought to the electrophysiology lab in the fasting state. The patient was then adequately sedated with anesthesia as outlined in the anesthesia report. The patient's right groin was prepped and draped in the usual sterile fashion by the EP lab staff. Using a percutaneous Seldinger technique a 6, 7, and 8-French hemostasis sheaths were placed into the right common femoral vein. A 7- [employer] Webster decapolar coronary sinus catheter was introduced through the right common femoral vein and advanced into the coronary sinus for recording and pacing from this location. A 6-French quadripolar Josephson catheter was introduced through the right common femoral vein and advanced into the right ventricle for recording and pacing. This catheter was then pulled back to the His bundle location. The patient presented to the electrophysiology lab in atrial flutter. The surface electrocardiogram was consistent with typical atrial flutter. The coronary sinus activation sequence was proximal to distal and suggestive of right atrial flutter.  The atrial flutter cycle length was 270 msec. Atrial entrainment mapping was then performed. When pacing from the left atrium, a long post pacing interval was observed. With entrainment mapping from the cavotricuspid isthmus, the post pacing interval was equal to the tachycardia cycle length and therefore suggestive of isthmus-dependent right atrial flutter. The  patient's QRS duration measured 99 msec with an RR interval of 505 msec and an HV interval of 38 msec. I elected to perform cavotricuspid isthmus ablation.   A [employer] 7-French 10mm ablation catheter was introduced through the right common femoral vein and advanced into the right atrium. Mapping of the cavotricuspid isthmus was performed which revealed a standard isthmus. A series of 4 radiofrequency applications were delivered along the cavotricuspid isthmus with a target temperature of 60 degrees with power of 70 watts. During the first radiofrequency ablation lesion, the tachycardia slowed and then terminated. The patient remained in sinus rhythm thereafter. An additional 3 radiofrequency applications were then delivered with similar parameters in order to obtain complete bidirectional cavotricuspid isthmus block.   Following ablation, differential atrial pacing was performed from the low lateral right atrium. This confirmed bidirectional cavotricuspid isthmus block with a stimulus to earliest atrial activation recorded bidirectional across the isthmus measuring 170 msec. The patient was observed for 20 minutes without return of conduction through the isthmus.  Following ablation, the AH interval measured 123 msec with an HV interval of 43 msec. Rapid atrial pacing was performed, which revealed an AV Wenckebach cycle length of 520 msec with no evidence of PR greater than RR and no tachycardias induced when pacing down to a cycle length of 220 msec. Ventricular pacing was then performed which revealed VA dissociation when pacing at a cycle length of 600 msec.  The procedure was therefore considered completed. All catheters were removed, and the sheaths were aspirated and flushed. The sheaths were removed and hemostasis was assured. EBL<5610ml. There were no early apparent complications.   CONCLUSIONS:  1. Isthmus-dependent right atrial flutter upon presentation, successfully ablated along the  usual cavotricuspid isthmus.  2. Complete bidirectional cavotricuspid isthmus  block achieved.  3. No inducible arrhythmias following ablation.  4. No early apparent complications.  Ana Fearing Talma Aguillard,MD 05/21/2014 8:36 AM

## 2014-05-21 NOTE — Interval H&P Note (Signed)
History and Physical Interval Note:  05/21/2014 7:19 AM  Ana BibleElizabeth Hugill  has presented today for surgery, with the diagnosis of aflutter  The various methods of treatment have been discussed with the patient and family. After consideration of risks, benefits and other options for treatment, the patient has consented to  Procedure(s): ATRIAL FLUTTER ABLATION (N/A) as a surgical intervention .  The patient's history has been reviewed, patient examined, no change in status, stable for surgery.  I have reviewed the patient's chart and labs.  Questions were answered to the patient's satisfaction.     Hillis RangeJames Alzada Brazee

## 2014-05-21 NOTE — Progress Notes (Signed)
Site area: RFV Site Prior to Removal:  Level 0 Pressure Applied For:7320min Manual:  yes  Patient Status During Pull:  stable Post Pull Site:  Level 0 Post Pull Instructions Given:yes   Post Pull Pulses Present: palpable Dressing Applied:  clear Bedrest begins @ 0930 Comments:

## 2014-05-21 NOTE — Transfer of Care (Signed)
Immediate Anesthesia Transfer of Care Note  Patient: Ana BibleElizabeth Lopez  Procedure(s) Performed: Procedure(s): ATRIAL FLUTTER ABLATION (N/A)  Patient Location: PACU  Anesthesia Type:General  Level of Consciousness: awake, alert , oriented and patient cooperative  Airway & Oxygen Therapy: Patient Spontanous Breathing and Patient connected to nasal cannula oxygen  Post-op Assessment: Report given to RN, Post -op Vital signs reviewed and stable, Patient moving all extremities and Patient moving all extremities X 4  Post vital signs: Reviewed and stable  Last Vitals:  Filed Vitals:   05/21/14 0905  BP: 97/64  Pulse: 62  Temp:   Resp: 16    Complications: No apparent anesthesia complications

## 2014-05-25 ENCOUNTER — Telehealth: Payer: Self-pay | Admitting: Internal Medicine

## 2014-05-25 NOTE — Telephone Encounter (Signed)
She is better after taking Advil.  I let her know to try Tylenol if the pain persist and I would call her tomorrow to see if she is better

## 2014-05-25 NOTE — Telephone Encounter (Signed)
Had the ablation on Thurs.  [facility] today to find out how her hospital stay was.  She told them it was good but that she has had this neck and head pain( starts in the back of her neck and goes into her head). They recommended she call her MD.   She has had this pain before(prior to ablation) and took Aleve/Advil but was scared to take take the Aleve/Advil as she is on Xarelto.  I let her know she can try either to see if she gets relief and I will call her back in a little bit to see how she is doing  She feels better when she up moving around.  Took a walk to the park yesterday and today and the pain was not there. Only notices it when she lays down.

## 2014-05-25 NOTE — Telephone Encounter (Signed)
New message      Pt had an ablation last thurs.  She has a headache/neck pain that started yesterday.  She did not sleep good last night and is very uncomfortable.  Please advise.

## 2014-05-27 NOTE — Telephone Encounter (Signed)
Pt. Called because she states continue to have a headache from the ablation. She took tylenol  2 tablets  about 12 noon and it didn't help. Pt states she took Advil this past Sunday and did not help the headache ether. Pt states she has been reading about the headache after ablation and said that it will go away on its own. Pt would like to know how long it would be for her. Pt was advised to take another 2 tylenols 4 hours after her last dose,(12:00 noon) and that will be it for today. Pt is aware not to take Advil because she is taking Xarelto. Pt is aware that I will send this message to [facility], she probable know more or less how long her headache will last. Pt verbalized understanding.

## 2014-05-27 NOTE — Telephone Encounter (Signed)
F/u   Pt is now complaining of headaches from her ablations. She took tylenol but is isn't helping. Please advise pt.

## 2014-05-28 NOTE — Telephone Encounter (Addendum)
Discussed with Dr Johney FrameAllred and he feels these should subside with time. I called her today and the HA's are gone.  Says her energy level is good in the morning but by afternoon she is drained.  She does not feel her heart is fluttering. She will keep her follow up on 06/24/14

## 2014-06-10 ENCOUNTER — Telehealth: Payer: Self-pay | Admitting: Internal Medicine

## 2014-06-10 NOTE — Telephone Encounter (Signed)
Calling stating her headaches have returned.  States the pain starts in the back of her neck and goes into head.  Tylenol doesn't seem to help. Spoke w/Dr. Johney FrameAllred who suggests that she contact her PCP to evaluate.  He did not feel that the HA's were coming from having the ablation that was done 05/21/14.  She verbalizes understanding and will call Dr. Ludwig ClarksMoreira.

## 2014-06-10 NOTE — Telephone Encounter (Signed)
New Message  Pt calling to speak w/ Rn about returning Headaches pt experienced before. Pt also wanted to speak about xarelto rx. Please call back and dsicuss.

## 2014-06-16 ENCOUNTER — Other Ambulatory Visit: Payer: Self-pay

## 2014-06-24 ENCOUNTER — Encounter: Payer: Self-pay | Admitting: Internal Medicine

## 2014-06-24 ENCOUNTER — Ambulatory Visit (INDEPENDENT_AMBULATORY_CARE_PROVIDER_SITE_OTHER): Payer: Medicare Other | Admitting: Internal Medicine

## 2014-06-24 VITALS — BP 122/78 | HR 73 | Ht 64.5 in | Wt 153.2 lb

## 2014-06-24 DIAGNOSIS — I4892 Unspecified atrial flutter: Secondary | ICD-10-CM

## 2014-06-24 NOTE — Progress Notes (Signed)
Electrophysiology Office Note   Date:  06/24/2014   ID:  Ana BibleElizabeth Lopez, DOB 1929/03/05, MRN [id number]  PCP:  Ralene OkMOREIRA,ROY, MD  Cardiologist:  Dr Jacinto HalimGanji Primary Electrophysiologist: Hillis RangeJames Ismelda Weatherman, MD    Chief Complaint  Patient presents with  . Atrial Flutter     History of Present Illness: Ana Lopez is a 81 y.o. female who presents today for electrophysiology evaluation.   Since her recent atrial flutter ablation, she has done well.  She had a headache for several days which has resolved.  Denies other neuro symptoms.  Denies procedure related complications.   No symptoms of arrhythmia.  Today, she denies symptoms of  chest pain,  orthopnea, PND, lower extremity edema, claudication,  syncope, or bleeding. The patient is tolerating medications without difficulties and is otherwise without complaint today.    Past Medical History  Diagnosis Date  . DDD (degenerative disc disease), cervical   . GERD (gastroesophageal reflux disease)   . Hyperlipidemia   . DJD (degenerative joint disease)   . Chronic back pain    Past Surgical History  Procedure Laterality Date  . Tonsillectomy    . Breast mass excision  1954    benign  . Total abdominal hysterectomy    . Arthroscopy    . Right total hip arthroplasty    . Atrial flutter ablation N/A 05/21/2014    Procedure: ATRIAL FLUTTER ABLATION;  Surgeon: Hillis RangeJames Kamrynn Melott, MD;  [facility];  Service: Cardiovascular;  Laterality: N/A;     Current Outpatient Prescriptions  Medication Sig Dispense Refill  . fenofibrate micronized (LOFIBRA) 134 MG capsule Take 1 capsule by mouth daily.    . raloxifene (EVISTA) 60 MG tablet Take 1 tablet by mouth daily.    . Rivaroxaban (XARELTO) 20 MG TABS tablet Take 1 tablet (20 mg total) by mouth daily with supper. 30 tablet 11  . simvastatin (ZOCOR) 40 MG tablet Take 1 tablet by mouth daily.     No current facility-administered medications for this visit.    Allergies:   Sulfa  antibiotics   Social History:  The patient  reports that she has never smoked. She has never used smokeless tobacco. She reports that she drinks alcohol. She reports that she does not use illicit drugs.   Family History:  The patient's  family history includes Sudden death in her mother.    ROS:  Please see the history of present illness.   All other systems are reviewed and negative.    PHYSICAL EXAM: VS:  BP 122/78 mmHg  Pulse 73  Ht 5' 4.5" (1.638 m)  Wt 69.491 kg (153 lb 3.2 oz)  BMI 25.90 kg/m2 , BMI Body mass index is 25.9 kg/(m^2). GEN: Well nourished, well developed, in no acute distress HEENT: normal Neck: no JVD, carotid bruits, or masses Cardiac: RRR; no murmurs, rubs, or gallops,no edema  Respiratory:  clear to auscultation bilaterally, normal work of breathing GI: soft, nontender, nondistended, + BS MS: no deformity or atrophy Skin: warm and dry  Neuro:  Strength and sensation are intact Psych: euthymic mood, full affect  EKG:  EKG is ordered today. The ekg ordered today shows sinus rhythm   Recent Labs: 05/18/2014: BUN 18; Creatinine 0.92; Hemoglobin 13.6; Platelets 368.0; Potassium 4.1; Sodium 137    Lipid Panel  No results found for: CHOL, TRIG, HDL, CHOLHDL, VLDL, LDLCALC, LDLDIRECT   Wt Readings from Last 3 Encounters:  06/24/14 69.491 kg (153 lb 3.2 oz)  05/21/14 66.225 kg (146 lb)  05/18/14 70.761 kg (156 lb)      Other studies Reviewed: Additional studies/ records that were reviewed today include: 05/04/14 echo reveals preserved EF, mild MR, mild LA dilitation   ASSESSMENT AND PLAN:  1.  Typical atrial flutter Doing well s/p ablation without recurrence Stop xarelto  I will see as needed going forward  Signed, Hillis Range, MD  06/24/2014 10:38 AM     [facility] [address] [place] [address] [phone] (office) [phone] (fax)

## 2014-06-24 NOTE — Patient Instructions (Signed)
Medication Instructions:  Your physician has recommended you make the following change in your medication:  1) Stop Xarelto   Labwork: None ordered  Testing/Procedures: None ordered  Follow-Up: Your physician recommends that you schedule a follow-up appointment as needed   Any Other Special Instructions Will Be Listed Below (If Applicable).

## 2014-07-02 ENCOUNTER — Ambulatory Visit: Payer: Medicare Other | Admitting: Cardiovascular Disease

## 2015-05-20 ENCOUNTER — Other Ambulatory Visit: Payer: Self-pay | Admitting: Internal Medicine

## 2015-05-20 ENCOUNTER — Ambulatory Visit
Admission: RE | Admit: 2015-05-20 | Discharge: 2015-05-20 | Disposition: A | Discharge status: Home / Self Care | Payer: Medicare Other | Source: Ambulatory Visit | Attending: Internal Medicine | Admitting: Internal Medicine

## 2015-05-20 DIAGNOSIS — M25552 Pain in left hip: Secondary | ICD-10-CM

## 2015-07-08 ENCOUNTER — Other Ambulatory Visit: Payer: Self-pay | Admitting: Orthopedic Surgery

## 2015-07-30 ENCOUNTER — Encounter (HOSPITAL_COMMUNITY)
Admission: RE | Admit: 2015-07-30 | Discharge: 2015-07-30 | Disposition: A | Discharge status: Home / Self Care | Payer: Medicare Other | Source: Ambulatory Visit | Attending: Orthopedic Surgery | Admitting: Orthopedic Surgery

## 2015-07-30 ENCOUNTER — Encounter (HOSPITAL_COMMUNITY): Payer: Self-pay

## 2015-07-30 ENCOUNTER — Ambulatory Visit (HOSPITAL_COMMUNITY)
Admission: RE | Admit: 2015-07-30 | Discharge: 2015-07-30 | Disposition: A | Discharge status: Home / Self Care | Payer: Medicare Other | Source: Ambulatory Visit | Attending: Orthopedic Surgery | Admitting: Orthopedic Surgery

## 2015-07-30 DIAGNOSIS — M1612 Unilateral primary osteoarthritis, left hip: Secondary | ICD-10-CM | POA: Insufficient documentation

## 2015-07-30 DIAGNOSIS — Z0183 Encounter for blood typing: Secondary | ICD-10-CM | POA: Diagnosis not present

## 2015-07-30 DIAGNOSIS — Z01818 Encounter for other preprocedural examination: Secondary | ICD-10-CM | POA: Insufficient documentation

## 2015-07-30 DIAGNOSIS — Z01812 Encounter for preprocedural laboratory examination: Secondary | ICD-10-CM | POA: Insufficient documentation

## 2015-07-30 HISTORY — DX: Cardiac arrhythmia, unspecified: I49.9

## 2015-07-30 HISTORY — DX: Pneumonia, unspecified organism: J18.9

## 2015-07-30 LAB — URINE MICROSCOPIC-ADD ON: RBC / HPF: NONE SEEN RBC/hpf (ref 0–5)

## 2015-07-30 LAB — ABO/RH: ABO/RH(D): O POS

## 2015-07-30 LAB — CBC
HCT: 40.5 % (ref 36.0–46.0)
Hemoglobin: 12.8 g/dL (ref 12.0–15.0)
MCH: 30.7 pg (ref 26.0–34.0)
MCHC: 31.6 g/dL (ref 30.0–36.0)
MCV: 97.1 fL (ref 78.0–100.0)
PLATELETS: 319 10*3/uL (ref 150–400)
RBC: 4.17 MIL/uL (ref 3.87–5.11)
RDW: 14 % (ref 11.5–15.5)
WBC: 6.5 10*3/uL (ref 4.0–10.5)

## 2015-07-30 LAB — URINALYSIS, ROUTINE W REFLEX MICROSCOPIC
Bilirubin Urine: NEGATIVE
GLUCOSE, UA: NEGATIVE mg/dL
Hgb urine dipstick: NEGATIVE
Ketones, ur: NEGATIVE mg/dL
Nitrite: NEGATIVE
PH: 6.5 (ref 5.0–8.0)
Protein, ur: NEGATIVE mg/dL
Specific Gravity, Urine: 1.018 (ref 1.005–1.030)

## 2015-07-30 LAB — TYPE AND SCREEN
ABO/RH(D): O POS
Antibody Screen: NEGATIVE

## 2015-07-30 LAB — BASIC METABOLIC PANEL
Anion gap: 9 (ref 5–15)
BUN: 17 mg/dL (ref 6–20)
CALCIUM: 9.5 mg/dL (ref 8.9–10.3)
CHLORIDE: 107 mmol/L (ref 101–111)
CO2: 22 mmol/L (ref 22–32)
CREATININE: 0.9 mg/dL (ref 0.44–1.00)
GFR calc Af Amer: >60 mL/min (ref >60)
GFR calc non Af Amer: 57 mL/min — ABNORMAL LOW (ref >60)
Glucose, Bld: 93 mg/dL (ref 65–99)
Potassium: 4.1 mmol/L (ref 3.5–5.1)
SODIUM: 138 mmol/L (ref 135–145)

## 2015-07-30 LAB — SURGICAL PCR SCREEN
MRSA, PCR: NEGATIVE
Staphylococcus aureus: NEGATIVE

## 2015-07-30 NOTE — Pre-Procedure Instructions (Addendum)
Ana Lopez  07/30/2015      [facility] [address] Ana Lopez- Ana, Lopez - [id number] [place] [id number] Phone: [phone] Fax: [phone]    Your procedure is scheduled on Tuesday 08/10/15.  [facility] at 900 A.M.  Call this number if you have problems the morning of surgery:  [phone]   Remember:  Do not eat food or drink liquids after midnight.  Take these medicines the morning of surgery with A SIP OF WATER hydrocodone if needed  STOP all herbel meds, nsaids (aleve,naproxen,advil,ibuprofen) 5 days prior to surgery. Starting 08/05/15 including vitamins,aspirin   Stop evista prior to surgery last dose  07/30/15   Do not wear jewelry, make-up or nail polish.  Do not wear lotions, powders, or perfumes.  You may wear deoderant.  Do not shave 48 hours prior to surgery.  Men may shave face and neck.  Do not bring valuables to the hospital.  [facility] is not responsible for any belongings or valuables.  Contacts, dentures or bridgework may not be worn into surgery.  Leave your suitcase in the car.  After surgery it may be brought to your room.  For patients admitted to the hospital, discharge time will be determined by your treatment team.  Patients discharged the day of surgery will not be allowed to drive home.   Name and phone number of your driver:    Special instructions:  Special Instructions: [facility] - Preparing for Surgery  Before surgery, you can play an important role.  Because skin is not sterile, your skin needs to be as free of germs as possible.  You can reduce the number of germs on you skin by washing with CHG (chlorahexidine gluconate) soap before surgery.  CHG is an antiseptic cleaner which kills germs and bonds with the skin to continue killing germs even after washing.  Please DO NOT use if you have an allergy to CHG or antibacterial soaps.  If your skin becomes  reddened/irritated stop using the CHG and inform your nurse when you arrive at Short Stay.  Do not shave (including legs and underarms) for at least 48 hours prior to the first CHG shower.  You may shave your face.  Please follow these instructions carefully:   1.  Shower with CHG Soap the night before surgery and the morning of Surgery.  2.  If you choose to wash your hair, wash your hair first as usual with your normal shampoo.  3.  After you shampoo, rinse your hair and body thoroughly to remove the Shampoo.  4.  Use CHG as you would any other liquid soap.  You can apply chg directly  to the skin and wash gently with scrungie or a clean washcloth.  5.  Apply the CHG Soap to your body ONLY FROM THE NECK DOWN.  Do not use on open wounds or open sores.  Avoid contact with your eyes ears, mouth and genitals (private parts).  Wash genitals (private parts)       with your normal soap.  6.  Wash thoroughly, paying special attention to the area where your surgery will be performed.  7.  Thoroughly rinse your body with warm water from the neck down.  8.  DO NOT shower/wash with your normal soap after using and rinsing off the CHG Soap.  9.  Pat yourself dry with a clean towel.  10.  Wear clean pajamas.            11.  Place clean sheets on your bed the night of your first shower and do not sleep with pets.  Day of Surgery  Do not apply any lotions/deodorants the morning of surgery.  Please wear clean clothes to the hospital/surgery center.  Please read over the following fact sheets that you were given. Pain Booklet, MRSA Information and Surgical Site Infection Prevention

## 2015-07-31 LAB — URINE CULTURE: Culture: NO GROWTH

## 2015-08-02 NOTE — Progress Notes (Signed)
Anesthesia Chart Review:  Pt is an 82 year old female scheduled for L total hip arthroplasty anterior approach on 08/10/2015 with Cammy CopaScott Gregory Dean, MD.   PMH includes:  Atrial flutter (s/p ablation 05/21/14), hyperlipidemia, GERD. Never smoker. BMI 30  Medications include: ASA, fenofibrate, evista, simvastatin.   Preoperative labs reviewed.   Chest x-ray 07/30/15 reviewed. No acute cardiopulmonary disease  EKG 07/30/15: NSR.  Pt was regularly seeing Hillis RangeJames Allred, MD, an EP cardiology was after ablation for a-flutter in April, she is to f/u with him prn.   If no changes, I anticipate pt can proceed with surgery as scheduled.   Rica Mastngela Kabbe, FNP-BC [facility]/Anesthesiology Phone: [phone] 08/02/2015 11:55 AM

## 2015-08-09 ENCOUNTER — Encounter (HOSPITAL_COMMUNITY): Payer: Self-pay | Admitting: Anesthesiology

## 2015-08-09 NOTE — Anesthesia Preprocedure Evaluation (Addendum)
Anesthesia Evaluation  Patient identified by MRN, date of birth, ID band Patient awake    Reviewed: Allergy & Precautions, NPO status , Patient's Chart, lab work & pertinent test results  Airway Mallampati: II  TM Distance: >3 FB Neck ROM: Full    Dental no notable dental hx. (+) Teeth Intact, Dental Advisory Given   Pulmonary pneumonia, resolved,    Pulmonary exam normal breath sounds clear to auscultation       Cardiovascular Normal cardiovascular exam+ dysrhythmias Atrial Fibrillation  Rhythm:Regular Rate:Normal  S/P Ablation for A. Fib.   Neuro/Psych negative neurological ROS  negative psych ROS   GI/Hepatic GERD  Medicated and Controlled,Pt reports that she skipped her prilosec the last two days, that she often gets terrible heartburn   Endo/Other  hyperlipidemia  Renal/GU   negative genitourinary   Musculoskeletal  (+) Arthritis , Osteoarthritis,  Cervical DDD Chronic back pain Left hip OA    Abdominal   Peds  Hematology negative hematology ROS (+)   Anesthesia Other Findings   Reproductive/Obstetrics                           Anesthesia Physical Anesthesia Plan  ASA: III  Anesthesia Plan: Spinal   Post-op Pain Management:    Induction: Intravenous  Airway Management Planned: Natural Airway and Simple Face Mask  Additional Equipment:   Intra-op Plan:   Post-operative Plan:   Informed Consent: I have reviewed the patients History and Physical, chart, labs and discussed the procedure including the risks, benefits and alternatives for the proposed anesthesia with the patient or authorized representative who has indicated his/her understanding and acceptance.   Dental advisory given  Plan Discussed with: Anesthesiologist, CRNA and Surgeon  Anesthesia Plan Comments:        Anesthesia Quick Evaluation

## 2015-08-10 ENCOUNTER — Encounter (HOSPITAL_COMMUNITY)
Admission: RE | Disposition: A | Discharge status: Home Health | Payer: Self-pay | Source: Ambulatory Visit | Attending: Orthopedic Surgery

## 2015-08-10 ENCOUNTER — Inpatient Hospital Stay (HOSPITAL_COMMUNITY): Payer: Medicare Other

## 2015-08-10 ENCOUNTER — Encounter (HOSPITAL_COMMUNITY): Payer: Self-pay | Admitting: Anesthesiology

## 2015-08-10 ENCOUNTER — Inpatient Hospital Stay (HOSPITAL_COMMUNITY): Payer: Medicare Other | Admitting: Anesthesiology

## 2015-08-10 ENCOUNTER — Inpatient Hospital Stay (HOSPITAL_COMMUNITY)
Admission: RE | Admit: 2015-08-10 | Discharge: 2015-08-13 | DRG: 470 | Disposition: A | Discharge status: Home Health | Payer: Medicare Other | Source: Ambulatory Visit | Attending: Orthopedic Surgery | Admitting: Orthopedic Surgery

## 2015-08-10 ENCOUNTER — Inpatient Hospital Stay (HOSPITAL_COMMUNITY): Payer: Medicare Other | Admitting: Emergency Medicine

## 2015-08-10 DIAGNOSIS — I4892 Unspecified atrial flutter: Secondary | ICD-10-CM | POA: Diagnosis present

## 2015-08-10 DIAGNOSIS — Z90710 Acquired absence of both cervix and uterus: Secondary | ICD-10-CM

## 2015-08-10 DIAGNOSIS — K219 Gastro-esophageal reflux disease without esophagitis: Secondary | ICD-10-CM | POA: Diagnosis present

## 2015-08-10 DIAGNOSIS — G8929 Other chronic pain: Secondary | ICD-10-CM | POA: Diagnosis present

## 2015-08-10 DIAGNOSIS — Z7982 Long term (current) use of aspirin: Secondary | ICD-10-CM | POA: Diagnosis not present

## 2015-08-10 DIAGNOSIS — E785 Hyperlipidemia, unspecified: Secondary | ICD-10-CM | POA: Diagnosis present

## 2015-08-10 DIAGNOSIS — M5030 Other cervical disc degeneration, unspecified cervical region: Secondary | ICD-10-CM | POA: Diagnosis present

## 2015-08-10 DIAGNOSIS — Z96641 Presence of right artificial hip joint: Secondary | ICD-10-CM | POA: Diagnosis present

## 2015-08-10 DIAGNOSIS — M549 Dorsalgia, unspecified: Secondary | ICD-10-CM | POA: Diagnosis present

## 2015-08-10 DIAGNOSIS — M25552 Pain in left hip: Secondary | ICD-10-CM | POA: Diagnosis present

## 2015-08-10 DIAGNOSIS — M1612 Unilateral primary osteoarthritis, left hip: Secondary | ICD-10-CM | POA: Diagnosis present

## 2015-08-10 DIAGNOSIS — Z419 Encounter for procedure for purposes other than remedying health state, unspecified: Secondary | ICD-10-CM

## 2015-08-10 DIAGNOSIS — M1610 Unilateral primary osteoarthritis, unspecified hip: Secondary | ICD-10-CM | POA: Diagnosis present

## 2015-08-10 HISTORY — PX: TOTAL HIP ARTHROPLASTY: SHX124

## 2015-08-10 SURGERY — ARTHROPLASTY, HIP, TOTAL, ANTERIOR APPROACH
Anesthesia: Spinal | Site: Hip | Laterality: Left

## 2015-08-10 MED ORDER — DEXTROSE 5 % IV SOLN
500.0000 mg | Freq: Four times a day (QID) | INTRAVENOUS | Status: DC | PRN
  Filled 2015-08-10: qty 5

## 2015-08-10 MED ORDER — MENTHOL 3 MG MT LOZG: 1.0000 | LOZENGE | OROMUCOSAL | Status: DC | PRN

## 2015-08-10 MED ORDER — RIVAROXABAN 10 MG PO TABS
10.0000 mg | ORAL_TABLET | Freq: Every day | ORAL | Status: DC
  Administered 2015-08-11 – 2015-08-13 (×3): 10 mg via ORAL
  Filled 2015-08-10 (×3): qty 1

## 2015-08-10 MED ORDER — SIMVASTATIN 40 MG PO TABS
40.0000 mg | ORAL_TABLET | Freq: Every evening | ORAL | Status: DC
  Administered 2015-08-10 – 2015-08-12 (×3): 40 mg via ORAL
  Filled 2015-08-10 (×3): qty 1

## 2015-08-10 MED ORDER — SODIUM CHLORIDE 0.9 % IR SOLN
Status: DC | PRN
  Administered 2015-08-10: 1000 mL

## 2015-08-10 MED ORDER — FENTANYL CITRATE (PF) 100 MCG/2ML IJ SOLN
25.0000 ug | INTRAMUSCULAR | Status: DC | PRN
  Administered 2015-08-10 (×2): 25 ug via INTRAVENOUS

## 2015-08-10 MED ORDER — ONDANSETRON HCL 4 MG PO TABS: 4.0000 mg | ORAL_TABLET | Freq: Four times a day (QID) | ORAL | Status: DC | PRN

## 2015-08-10 MED ORDER — VITAMIN D 1000 UNITS PO TABS
2000.0000 [IU] | ORAL_TABLET | Freq: Every day | ORAL | Status: DC
  Administered 2015-08-11 – 2015-08-13 (×3): 2000 [IU] via ORAL
  Filled 2015-08-10 (×3): qty 2

## 2015-08-10 MED ORDER — ONDANSETRON HCL 4 MG/2ML IJ SOLN
INTRAMUSCULAR | Status: AC
  Filled 2015-08-10: qty 2

## 2015-08-10 MED ORDER — LACTATED RINGERS IV SOLN
INTRAVENOUS | Status: DC | PRN
  Administered 2015-08-10 (×2): via INTRAVENOUS

## 2015-08-10 MED ORDER — ACETAMINOPHEN 325 MG PO TABS: 650.0000 mg | ORAL_TABLET | Freq: Four times a day (QID) | ORAL | Status: DC | PRN

## 2015-08-10 MED ORDER — METOCLOPRAMIDE HCL 5 MG/ML IJ SOLN: 10.0000 mg | Freq: Once | INTRAMUSCULAR | Status: DC | PRN

## 2015-08-10 MED ORDER — HYDROCODONE-ACETAMINOPHEN 5-325 MG PO TABS
1.0000 | ORAL_TABLET | ORAL | Status: DC | PRN
  Administered 2015-08-10 – 2015-08-11 (×7): 2 via ORAL
  Administered 2015-08-12: 1 via ORAL
  Administered 2015-08-12: 2 via ORAL
  Administered 2015-08-13 (×2): 1 via ORAL
  Filled 2015-08-10: qty 2
  Filled 2015-08-10 (×2): qty 1
  Filled 2015-08-10 (×3): qty 2
  Filled 2015-08-10: qty 1
  Filled 2015-08-10 (×2): qty 2
  Filled 2015-08-10: qty 1
  Filled 2015-08-10 (×2): qty 2

## 2015-08-10 MED ORDER — CEFAZOLIN SODIUM-DEXTROSE 2-4 GM/100ML-% IV SOLN
INTRAVENOUS | Status: AC
  Filled 2015-08-10: qty 100

## 2015-08-10 MED ORDER — FENTANYL CITRATE (PF) 100 MCG/2ML IJ SOLN
INTRAMUSCULAR | Status: AC
  Administered 2015-08-10: 25 ug via INTRAVENOUS
  Filled 2015-08-10: qty 2

## 2015-08-10 MED ORDER — METHOCARBAMOL 500 MG PO TABS
500.0000 mg | ORAL_TABLET | Freq: Four times a day (QID) | ORAL | Status: DC | PRN
Start: 2015-08-10 — End: 2015-08-13
  Administered 2015-08-10 – 2015-08-11 (×2): 500 mg via ORAL
  Filled 2015-08-10 (×2): qty 1

## 2015-08-10 MED ORDER — FENOFIBRATE 54 MG PO TABS
54.0000 mg | ORAL_TABLET | Freq: Every day | ORAL | Status: DC
  Administered 2015-08-11 – 2015-08-13 (×3): 54 mg via ORAL
  Filled 2015-08-10 (×4): qty 1

## 2015-08-10 MED ORDER — 0.9 % SODIUM CHLORIDE (POUR BTL) OPTIME
TOPICAL | Status: DC | PRN
  Administered 2015-08-10: 1000 mL

## 2015-08-10 MED ORDER — METOCLOPRAMIDE HCL 5 MG/ML IJ SOLN: 5.0000 mg | Freq: Three times a day (TID) | INTRAMUSCULAR | Status: DC | PRN

## 2015-08-10 MED ORDER — TRANEXAMIC ACID 1000 MG/10ML IV SOLN
1000.0000 mg | INTRAVENOUS | Status: AC
  Administered 2015-08-10: 1000 mg via INTRAVENOUS
  Filled 2015-08-10: qty 10

## 2015-08-10 MED ORDER — TRANEXAMIC ACID 1000 MG/10ML IV SOLN
2000.0000 mg | Freq: Once | INTRAVENOUS | Status: DC
  Filled 2015-08-10: qty 20

## 2015-08-10 MED ORDER — METOCLOPRAMIDE HCL 5 MG PO TABS
5.0000 mg | ORAL_TABLET | Freq: Three times a day (TID) | ORAL | Status: DC | PRN
  Administered 2015-08-10: 5 mg via ORAL
  Filled 2015-08-10: qty 1

## 2015-08-10 MED ORDER — PANTOPRAZOLE SODIUM 40 MG IV SOLR
40.0000 mg | INTRAVENOUS | Status: AC
  Administered 2015-08-10: 40 mg via INTRAVENOUS
  Filled 2015-08-10: qty 40

## 2015-08-10 MED ORDER — TRANEXAMIC ACID 1000 MG/10ML IV SOLN
2000.0000 mg | INTRAVENOUS | Status: DC | PRN
  Administered 2015-08-10: 2000 mg via INTRAVENOUS

## 2015-08-10 MED ORDER — CHLORHEXIDINE GLUCONATE 4 % EX LIQD: 60.0000 mL | Freq: Once | CUTANEOUS | Status: DC

## 2015-08-10 MED ORDER — FENTANYL CITRATE (PF) 250 MCG/5ML IJ SOLN
INTRAMUSCULAR | Status: AC
  Filled 2015-08-10: qty 5

## 2015-08-10 MED ORDER — FENTANYL CITRATE (PF) 100 MCG/2ML IJ SOLN
INTRAMUSCULAR | Status: DC | PRN
  Administered 2015-08-10 (×2): 25 ug via INTRAVENOUS
  Administered 2015-08-10: 50 ug via INTRAVENOUS

## 2015-08-10 MED ORDER — CEFAZOLIN SODIUM-DEXTROSE 2-4 GM/100ML-% IV SOLN
2.0000 g | INTRAVENOUS | Status: AC
  Administered 2015-08-10: 2 g via INTRAVENOUS

## 2015-08-10 MED ORDER — PROPOFOL 10 MG/ML IV BOLUS
INTRAVENOUS | Status: AC
  Filled 2015-08-10: qty 40

## 2015-08-10 MED ORDER — PHENOL 1.4 % MT LIQD: 1.0000 | OROMUCOSAL | Status: DC | PRN

## 2015-08-10 MED ORDER — PHENYLEPHRINE HCL 10 MG/ML IJ SOLN
10.0000 mg | INTRAVENOUS | Status: DC | PRN
  Administered 2015-08-10: 20 ug/min via INTRAVENOUS

## 2015-08-10 MED ORDER — POTASSIUM CHLORIDE IN NACL 20-0.9 MEQ/L-% IV SOLN
INTRAVENOUS | Status: AC
  Administered 2015-08-10: 14:00:00 via INTRAVENOUS
  Filled 2015-08-10: qty 1000

## 2015-08-10 MED ORDER — FAMOTIDINE IN NACL 20-0.9 MG/50ML-% IV SOLN
20.0000 mg | INTRAVENOUS | Status: DC
  Filled 2015-08-10: qty 50

## 2015-08-10 MED ORDER — HYDROCODONE-ACETAMINOPHEN 5-325 MG PO TABS
ORAL_TABLET | ORAL | Status: AC
  Filled 2015-08-10: qty 2

## 2015-08-10 MED ORDER — RALOXIFENE HCL 60 MG PO TABS
60.0000 mg | ORAL_TABLET | Freq: Every day | ORAL | Status: DC
  Administered 2015-08-11 – 2015-08-13 (×3): 60 mg via ORAL
  Filled 2015-08-10 (×4): qty 1

## 2015-08-10 MED ORDER — ACETAMINOPHEN 650 MG RE SUPP: 650.0000 mg | Freq: Four times a day (QID) | RECTAL | Status: DC | PRN

## 2015-08-10 MED ORDER — CEFAZOLIN IN D5W 1 GM/50ML IV SOLN
1.0000 g | Freq: Four times a day (QID) | INTRAVENOUS | Status: AC
  Administered 2015-08-10 (×2): 1 g via INTRAVENOUS
  Filled 2015-08-10 (×2): qty 50

## 2015-08-10 MED ORDER — MORPHINE SULFATE (PF) 2 MG/ML IV SOLN
2.0000 mg | INTRAVENOUS | Status: DC | PRN
  Administered 2015-08-10: 2 mg via INTRAVENOUS
  Filled 2015-08-10: qty 1

## 2015-08-10 MED ORDER — ONDANSETRON HCL 4 MG/2ML IJ SOLN
4.0000 mg | Freq: Four times a day (QID) | INTRAMUSCULAR | Status: DC | PRN
  Administered 2015-08-12: 4 mg via INTRAVENOUS
  Filled 2015-08-10: qty 2

## 2015-08-10 MED ORDER — BUPIVACAINE HCL (PF) 0.5 % IJ SOLN
INTRAMUSCULAR | Status: DC | PRN
  Administered 2015-08-10: 3 mL via INTRATHECAL

## 2015-08-10 MED ORDER — PROPOFOL 500 MG/50ML IV EMUL
INTRAVENOUS | Status: DC | PRN
  Administered 2015-08-10: 25 ug/kg/min via INTRAVENOUS

## 2015-08-10 SURGICAL SUPPLY — 59 items
BAG DECANTER FOR FLEXI CONT (MISCELLANEOUS) ×3 IMPLANT
BENZOIN TINCTURE PRP APPL 2/3 (GAUZE/BANDAGES/DRESSINGS) ×3 IMPLANT
BLADE SAW SGTL 18X1.27X75 (BLADE) ×2 IMPLANT
BLADE SAW SGTL 18X1.27X75MM (BLADE) ×1
BLADE SURG ROTATE 9660 (MISCELLANEOUS) IMPLANT
CAPT HIP TOTAL 2 ×3 IMPLANT
CELLS DAT CNTRL 66122 CELL SVR (MISCELLANEOUS) ×1 IMPLANT
CLOSURE STERI-STRIP 1/2X4 (GAUZE/BANDAGES/DRESSINGS) ×1
CLOSURE WOUND 1/2 X4 (GAUZE/BANDAGES/DRESSINGS) ×2
CLSR STERI-STRIP ANTIMIC 1/2X4 (GAUZE/BANDAGES/DRESSINGS) ×2 IMPLANT
COVER SURGICAL LIGHT HANDLE (MISCELLANEOUS) ×3 IMPLANT
DRAPE C-ARM 42X72 X-RAY (DRAPES) ×3 IMPLANT
DRAPE STERI IOBAN 125X83 (DRAPES) ×3 IMPLANT
DRAPE U-SHAPE 47X51 STRL (DRAPES) ×9 IMPLANT
DRESSING ALLEVYN LIFE SACRUM (GAUZE/BANDAGES/DRESSINGS) ×3 IMPLANT
DRSG AQUACEL AG ADV 3.5X10 (GAUZE/BANDAGES/DRESSINGS) ×3 IMPLANT
DURAPREP 26ML APPLICATOR (WOUND CARE) ×3 IMPLANT
ELECT BLADE 4.0 EZ CLEAN MEGAD (MISCELLANEOUS) ×3
ELECT BLADE 6.5 EXT (BLADE) IMPLANT
ELECT REM PT RETURN 9FT ADLT (ELECTROSURGICAL) ×3
ELECTRODE BLDE 4.0 EZ CLN MEGD (MISCELLANEOUS) ×1 IMPLANT
ELECTRODE REM PT RTRN 9FT ADLT (ELECTROSURGICAL) ×1 IMPLANT
FACESHIELD WRAPAROUND (MASK) ×6 IMPLANT
GLOVE BIOGEL PI IND STRL 8 (GLOVE) ×2 IMPLANT
GLOVE BIOGEL PI INDICATOR 8 (GLOVE) ×4
GLOVE ECLIPSE 8.0 STRL XLNG CF (GLOVE) ×3 IMPLANT
GLOVE ORTHO TXT STRL SZ7.5 (GLOVE) ×6 IMPLANT
GOWN STRL REUS W/ TWL LRG LVL3 (GOWN DISPOSABLE) ×2 IMPLANT
GOWN STRL REUS W/ TWL XL LVL3 (GOWN DISPOSABLE) ×2 IMPLANT
GOWN STRL REUS W/TWL LRG LVL3 (GOWN DISPOSABLE) ×4
GOWN STRL REUS W/TWL XL LVL3 (GOWN DISPOSABLE) ×4
HANDPIECE INTERPULSE COAX TIP (DISPOSABLE) ×2
KIT BASIN OR (CUSTOM PROCEDURE TRAY) ×3 IMPLANT
KIT ROOM TURNOVER OR (KITS) ×3 IMPLANT
MANIFOLD NEPTUNE II (INSTRUMENTS) ×3 IMPLANT
NDL SUT 6 .5 CRC .975X.05 MAYO (NEEDLE) ×1 IMPLANT
NEEDLE MAYO TAPER (NEEDLE) ×2
NS IRRIG 1000ML POUR BTL (IV SOLUTION) ×15 IMPLANT
PACK TOTAL JOINT (CUSTOM PROCEDURE TRAY) ×3 IMPLANT
PAD ARMBOARD 7.5X6 YLW CONV (MISCELLANEOUS) ×3 IMPLANT
RTRCTR WOUND ALEXIS 18CM MED (MISCELLANEOUS) ×3
SET HNDPC FAN SPRY TIP SCT (DISPOSABLE) ×1 IMPLANT
SPONGE LAP 18X18 X RAY DECT (DISPOSABLE) ×3 IMPLANT
STAPLER VISISTAT 35W (STAPLE) IMPLANT
STRIP CLOSURE SKIN 1/2X4 (GAUZE/BANDAGES/DRESSINGS) ×4 IMPLANT
SUT ETHIBOND NAB CT1 #1 30IN (SUTURE) ×3 IMPLANT
SUT ETHILON 3 0 PS 1 (SUTURE) ×3 IMPLANT
SUT MNCRL AB 4-0 PS2 18 (SUTURE) IMPLANT
SUT VIC AB 0 CT1 27 (SUTURE) ×6
SUT VIC AB 0 CT1 27XBRD ANBCTR (SUTURE) ×3 IMPLANT
SUT VIC AB 1 CT1 27 (SUTURE) ×2
SUT VIC AB 1 CT1 27XBRD ANBCTR (SUTURE) ×1 IMPLANT
SUT VIC AB 2-0 CT1 27 (SUTURE) ×4
SUT VIC AB 2-0 CT1 TAPERPNT 27 (SUTURE) ×2 IMPLANT
TOWEL OR 17X24 6PK STRL BLUE (TOWEL DISPOSABLE) ×3 IMPLANT
TOWEL OR 17X26 10 PK STRL BLUE (TOWEL DISPOSABLE) ×3 IMPLANT
TRAY FOLEY CATH 16FRSI W/METER (SET/KITS/TRAYS/PACK) IMPLANT
WATER STERILE IRR 1000ML POUR (IV SOLUTION) IMPLANT
YANKAUER SUCT BULB TIP NO VENT (SUCTIONS) ×3 IMPLANT

## 2015-08-10 NOTE — Evaluation (Signed)
Physical Therapy Evaluation Patient Details Name: Ana Lopez MRN: [id number] DOB: [birth year] Today's Date: 08/10/2015   History of Present Illness  Pt is a 82 y/o F s/p Lt THA.  Pt's PMH includes chronic back pain, Lt benign breast mass excision, Rt THA, atrial flutter ablation.    Clinical Impression  Pt is s/p Lt THA resulting in the deficits listed below (see PT Problem List). Ana Lopez is very motivated and eager to work with PT; however she was limited today due to her nausea and 10/10 pain in her Lt hip.  She currently requires min assist for bed mobility and min guard assist for transfers.  She will have 24/7 assist/supervision from her husband at d/c. Pt will benefit from skilled PT to increase their independence and safety with mobility to allow discharge to the venue listed below.     Follow Up Recommendations Home health PT;Supervision for mobility/OOB    Equipment Recommendations  Rolling walker with 5" wheels;3in1 (PT);Hospital bed (pt requesting hospital bed so she can stay downstairs)    Recommendations for Other Services OT consult     Precautions / Restrictions Precautions Precautions: Fall Precaution Comments: direct anterior approach, no hip precautions Restrictions Weight Bearing Restrictions: Yes LLE Weight Bearing: Weight bearing as tolerated      Mobility  Bed Mobility Overal bed mobility: Needs Assistance Bed Mobility: Supine to Sit     Supine to sit: Min assist;HOB elevated     General bed mobility comments: Assist moving Lt LE to EOB.  Pt requires increased time with use of bed rail and c/o nausea once sitting EOB.  Transfers Overall transfer level: Needs assistance Equipment used: Rolling walker (2 wheeled) Transfers: Sit to/from [employer] CorporationStand;Stand Pivot Transfers Sit to Stand: Min guard Stand pivot transfers: Min guard       General transfer comment: Cues for hand placement and min guard assist for safety as pt stands slowly.  Weight  shifting in standing prior to pivoting to chair.   Ambulation/Gait             General Gait Details: Pt declines ambulating due to nausea and 10/10 pain.  Stairs            Wheelchair Mobility    Modified Rankin (Stroke Patients Only)       Balance Overall balance assessment: Needs assistance Sitting-balance support: No upper extremity supported;Bilateral upper extremity supported;Feet supported Sitting balance-Leahy Scale: Fair Sitting balance - Comments: Bil UE support due to nausea, once this improves slightly pt is able to sit EOB without UE support   Standing balance support: Bilateral upper extremity supported;During functional activity Standing balance-Leahy Scale: Poor Standing balance comment: Relies on RW for support                             Pertinent Vitals/Pain Pain Assessment: 0-10 Pain Score: 10-Worst pain ever (down to 8/10 at end of session sitting in chair) Pain Location: Lt hip Pain Descriptors / Indicators: Aching;Grimacing;Guarding;Moaning Pain Intervention(s): Limited activity within patient's tolerance;Monitored during session;Repositioned;Patient requesting pain meds-RN notified;Premedicated before session    Home Living Family/patient expects to be discharged to:: Private residence Living Arrangements: Spouse/significant other Available Help at Discharge: Family;Available 24 hours/day Type of Home: House Home Access: Stairs to enter Entrance Stairs-Rails: Right Entrance Stairs-Number of Steps: 4 Home Layout: Two level Home Equipment: Grab bars - tub/shower;Cane - single point Additional Comments: Pt reports she is hoping to rent a hospital bed so  she can stay downstairs to avoid a full flight to get up to her bedroom    Prior Function Level of Independence: Independent         Comments: Not using AD.  Went for a longer walk each day.     Hand Dominance        Extremity/Trunk Assessment   Upper Extremity  Assessment: Defer to OT evaluation           Lower Extremity Assessment: LLE deficits/detail   LLE Deficits / Details: limited ROM and strength as expected s/p Lt THA     Communication   Communication: No difficulties  Cognition Arousal/Alertness: Awake/alert Behavior During Therapy: WFL for tasks assessed/performed Overall Cognitive Status: Within Functional Limits for tasks assessed                      General Comments      Exercises General Exercises - Lower Extremity Ankle Circles/Pumps: AROM;Both;10 reps;Supine Quad Sets: Strengthening;Both;5 reps;Seated Long Arc Quad: PROM;Left;5 reps;Seated      Assessment/Plan    PT Assessment Patient needs continued PT services  PT Diagnosis Difficulty walking;Abnormality of gait;Acute pain   PT Problem List Decreased strength;Decreased range of motion;Decreased activity tolerance;Decreased balance;Decreased mobility;Decreased knowledge of use of DME;Decreased safety awareness;Pain  PT Treatment Interventions DME instruction;Gait training;Stair training;Functional mobility training;Therapeutic activities;Therapeutic exercise;Balance training;Neuromuscular re-education;Patient/family education;Modalities   PT Goals (Current goals can be found in the Care Plan section) Acute Rehab PT Goals Patient Stated Goal: decreased pain PT Goal Formulation: With patient/family Time For Goal Achievement: 08/17/15 Potential to Achieve Goals: Good    Frequency 7X/week   Barriers to discharge Inaccessible home environment Steps at home    Co-evaluation               End of Session Equipment Utilized During Treatment: Gait belt Activity Tolerance: Other (comment);Patient limited by pain (limited due to nausea) Patient left: in chair;with call bell/phone within reach;with chair alarm set;with family/visitor present Nurse Communication: Mobility status;Other (comment);Patient requests pain meds;Weight bearing status (pt's  nausea)         Time: 1610-9604 PT Time Calculation (min) (ACUTE ONLY): 26 min   Charges:   PT Evaluation $PT Eval Moderate Complexity: 1 Procedure PT Treatments $Therapeutic Activity: 8-22 mins   PT G Codes:       Encarnacion Chu PT, DPT  Pager: [phone] Phone: [phone] 08/10/2015, 5:19 PM

## 2015-08-10 NOTE — H&P (Signed)
TOTAL HIP ADMISSION H&P  Patient is admitted for left total hip arthroplasty.  Subjective:  Chief Complaint: left hip pain  HPI: Ana Lopez, 82 y.o. female, has a history of pain and functional disability in the left hip(s) due to arthritis and patient has failed non-surgical conservative treatments for greater than 12 weeks to include NSAID's and/or analgesics, use of assistive devices and activity modification.  Onset of symptoms was gradual starting 5 years ago with gradually worsening course since that time.The patient noted no past surgery on the left hip(s).  Patient currently rates pain in the left hip at 8 out of 10 with activity. Patient has night pain, worsening of pain with activity and weight bearing, trendelenberg gait and pain that interfers with activities of daily living. Patient has evidence of joint space narrowing by imaging studies. This condition presents safety issues increasing the risk of falls. This patient has had a good result with her right total hip replacement done in 2007.  There is no current active infection.  Patient Active Problem List   Diagnosis Date Noted  . Atrial flutter, paroxysmal (HCC) 05/21/2014  . Atrial flutter (HCC) 05/18/2014   Past Medical History  Diagnosis Date  . DDD (degenerative disc disease), cervical   . GERD (gastroesophageal reflux disease)   . Hyperlipidemia   . DJD (degenerative joint disease)   . Chronic back pain   . Dysrhythmia     hx atrial flutter-ablation  . Pneumonia     3/17    Past Surgical History  Procedure Laterality Date  . Tonsillectomy    . Breast mass excision Left 1954    benign  . Total abdominal hysterectomy    . Arthroscopy Left     knee  . Right total hip arthroplasty Right 07  . Atrial flutter ablation N/A 05/21/2014    Procedure: ATRIAL FLUTTER ABLATION;  Surgeon: Hillis Range, MD;  Location: [facility] CATH LAB;  Service: Cardiovascular;  Laterality: N/A;    Prescriptions prior to admission   Medication Sig Dispense Refill Last Dose  . aspirin 81 MG tablet Take 81 mg by mouth daily.   Past Week at Unknown time  . cholecalciferol (VITAMIN D) 1000 units tablet Take 2,000 Units by mouth daily.   Past Week at Unknown time  . fenofibrate micronized (LOFIBRA) 134 MG capsule Take 1 capsule by mouth daily.   Past Week at Unknown time  . HYDROcodone-acetaminophen (NORCO/VICODIN) 5-325 MG tablet Take 1 tablet by mouth every 6 (six) hours as needed for moderate pain.   Past Week at Unknown time  . raloxifene (EVISTA) 60 MG tablet Take 1 tablet by mouth daily.   Past Week at Unknown time  . simvastatin (ZOCOR) 40 MG tablet Take 1 tablet by mouth every evening.    Unknown at Unknown time   Allergies  Allergen Reactions  . Sulfa Antibiotics Other (See Comments)    Numbness and skin hardening on the face      Social History  Substance Use Topics  . Smoking status: Never Smoker   . Smokeless tobacco: Never Used     Comment: tried cigarettes once in college  . Alcohol Use: 8.4 oz/week    0 Standard drinks or equivalent, 14 Glasses of wine per week     Comment: 2 glasses of white wine most evenings    Family History  Problem Relation Age of Onset  . Sudden death Mother     age 80s     Review of Systems  Constitutional: Negative.   Musculoskeletal: Positive for joint pain.  All other systems reviewed and are negative.   Objective:  Physical Exam  Constitutional: She appears well-developed.  HENT:  Head: Normocephalic.  Eyes: Pupils are equal, round, and reactive to light.  Neck: Normal range of motion.  Cardiovascular: Normal rate.   Respiratory: Effort normal.  Neurological: She is alert.  Skin: Skin is warm.  Psychiatric: She has a normal mood and affect.   examination of the left hip demonstrates groin pain with internal/external rotation of the leg intact ankle dorsiflexion plantar flexion strength palpable pedal pulses approximately equal leg lengths  Vital signs in  last 24 hours: Temp:  [98.3 F (36.8 C)] 98.3 F (36.8 C) (07/18 0609) Pulse Rate:  [74] 74 (07/18 0609) Resp:  [20] 20 (07/18 0609) BP: (128)/(67) 128/67 mmHg (07/18 0609) SpO2:  [97 %] 97 % (07/18 0609) Weight:  [68.947 kg (152 lb)] 68.947 kg (152 lb) (07/18 0609)  Labs:   Estimated body mass index is 29.69 kg/(m^2) as calculated from the following:   Height as of 07/30/15: 5' (1.524 m).   Weight as of this encounter: 68.947 kg (152 lb).   Imaging Review Plain radiographs demonstrate moderate degenerative joint disease of the left hip(s). The bone quality appears to be good for age and reported activity level.  Assessment/Plan:  End stage arthritis, left hip(s)  The patient history, physical examination, clinical judgement of the provider and imaging studies are consistent with end stage degenerative joint disease of the left hip(s) and total hip arthroplasty is deemed medically necessary. The treatment options including medical management, injection therapy, arthroscopy and arthroplasty were discussed at length. The risks and benefits of total hip arthroplasty were presented and reviewed. The risks due to aseptic loosening, infection, stiffness, dislocation/subluxation,  thromboembolic complications and other imponderables were discussed.  The patient acknowledged the explanation, agreed to proceed with the plan and consent was signed. Patient is being admitted for inpatient treatment for surgery, pain control, PT, OT, prophylactic antibiotics, VTE prophylaxis, progressive ambulation and ADL's and discharge planning.The patient is planning to be discharged home with home health services

## 2015-08-10 NOTE — Op Note (Signed)
NAME:  Ana Lopez, Ana Lopez NO.:  [id number]  MEDICAL RECORD NO.:  [id number]  LOCATION:  5N11C                        FACILITY:  [facility]  PHYSICIAN:  Burnard Bunting, M.D.    DATE OF BIRTH:  03-Mar-1929  DATE OF PROCEDURE: DATE OF DISCHARGE:                              OPERATIVE REPORT   PREOPERATIVE DIAGNOSIS:  Left hip arthritis.  POSTOPERATIVE DIAGNOSIS:  Left hip arthritis.  PROCEDURE:  Left total hip replacement using Corail stem size 10 with 52- mm Pinnacle cup, 4-mm offset liner and 36-mm stainless steel head.  SURGEON:  Burnard Bunting, M.D.  ASSISTANT:  Ana Lopez, RNFA.  INDICATIONS:  Ana Lopez is an 82 year old patient with left hip arthritis, refractory to nonoperative management, presents for operative management after explanation of risks and benefits.  PROCEDURE IN DETAIL:  The patient was brought to the operating room where spinal anesthetic was induced.  Preoperative antibiotics were administered.  Time-out was called.  The patient was placed on the Hana bed with the right leg supported.  Pre-surgical imaging obtained including an AP with the obturators symmetric as well as CT of the pelvis and the obturators symmetric as well as an AP of the affected hip.  Leg lengths approximately equal, but slightly shorter on the left preoperatively.  At this time, left hip was prescrubbed with alcohol and Betadine, allowed to air dry, prepped with DuraPrep solution, draped in a sterile manner.  Wall drape was utilized, incision was made 2 cm inferior and slightly distal to the anterior superior iliac crest.  Skin and subcutaneous tissue were sharply divided.  The tensor fascia lata was then split from the anterior superior iliac crest distally along the length of the 10-cm incision.  The muscle was then bluntly developed between that and the rectus.  Superior cover was placed on the superior aspect of the femoral neck.  With the retraction, the  crossing circumflex vessels were coagulated.  At this time, using a Cobb elevator, retractor was placed on the anterior inferior aspect of the femoral neck.  At this time, the arthrotomy was made in a flap type fashion with apex at the superior junction of the intertrochanteric line of the trochanter.  This was made under fluoroscopic guidance, approximately 1 fingerbreadth above the lesser.  The head was removed and sized to 45.  At this time, with about 40 degrees of external rotation with no traction, acetabular retractors were carefully placed with care being taken to avoid injury to the femoral nerve.  Labrum was excised.  The tibia was excised.  Reaming was then performed up to 51 mm under fluoroscopic guidance with the cup in about 45 degrees of abduction and 10 degrees of anteversion.  Pinnacle cup was placed with good purchase achieved, one screw utilized, +4 offset liner and then utilized.  Attention was then directed towards the femur.  Femoral hook was placed.  External rotation to 120 achieved and then the knee was taken into extension and adduction.  Mueller retractor placed. Trochanteric retractor placed.  The capsular flap was released and the conjoint tendon was released off the inner-surface of the trochanter. Lateralization performed with the rongeur and chili pepper and box cutter.  The femur  was then broached up to size 10, which gave good fill of the canal, which was confirmed the AP and lateral planes under fluoroscopy.  Size 10 stem was then placed.  At this time, trial reduction with +1.5, +5 was performed.  The patient had excellent stability with internal and external rotation as well as external rotation at 90 and about 45 degrees of extension with no traction.  +5 gave proximal equal leg lengths.  This was the one that was placed. Good stability was maintained.  Thorough irrigation was performed. Tranexamic acid-soaked sponge was allowed to place, standing  incision for about 3 minutes.  At this time, thorough irrigation performed and then the incision was closed with #1 Vicryl suture followed by reapproximating the tensor fascia lata with #1 Vicryl suture followed by 0 Vicryl suture, 2-0 Vicryl suture and a 3-0 Monocryl.  The patient tolerated the procedure well without immediate complication and transferred to the recovery room in stable condition.     Burnard BuntingG. Scott Dean, M.D.     GSD/MEDQ  D:  08/10/2015  T:  08/10/2015  Job:  [id number]

## 2015-08-10 NOTE — Brief Op Note (Signed)
08/10/2015  10:19 AM  PATIENT:  Dennie BibleElizabeth Pequignot  82 y.o. female  PRE-OPERATIVE DIAGNOSIS:  LEFT HIP OSTEOARTHRITIS  POST-OPERATIVE DIAGNOSIS:  LEFT HIP OSTEOARTHRITIS  PROCEDURE:  Procedure(s): TOTAL HIP ARTHROPLASTY ANTERIOR APPROACH  SURGEON:  Surgeon(s): Cammy CopaScott Aaryn Sermon, MD  ASSISTANT: Patrick Jupiterarla Bethune RNFA  ANESTHESIA:   spinal  EBL: 150 ml    Total I/O In: 1000 [I.V.:1000] Out: 150 [Blood:150]  BLOOD ADMINISTERED: none  DRAINS: none   LOCAL MEDICATIONS USED:  none  SPECIMEN:  No Specimen  COUNTS:  YES  TOURNIQUET:  * No tourniquets in log *  DICTATION: .Other Dictation: Dictation Number done  PLAN OF CARE: Admit to inpatient   PATIENT DISPOSITION:  PACU - hemodynamically stable

## 2015-08-10 NOTE — Transfer of Care (Signed)
Immediate Anesthesia Transfer of Care Note  Patient: Ana BibleElizabeth Lopez  Procedure(s) Performed: Procedure(s): TOTAL HIP ARTHROPLASTY ANTERIOR APPROACH (Left)  Patient Location: PACU  Anesthesia Type:Spinal  Level of Consciousness: awake, alert  and patient cooperative  Airway & Oxygen Therapy: Patient Spontanous Breathing and Patient connected to nasal cannula oxygen  Post-op Assessment: Report given to RN, Post -op Vital signs reviewed and stable and Patient moving all extremities  Post vital signs: Reviewed and stable  Last Vitals:  Filed Vitals:   08/10/15 0609  BP: 128/67  Pulse: 74  Temp: 36.8 C  Resp: 20    Last Pain: There were no vitals filed for this visit.    Patients Stated Pain Goal: 3 (08/10/15 [id number])  Complications: No apparent anesthesia complications

## 2015-08-10 NOTE — Anesthesia Postprocedure Evaluation (Signed)
Anesthesia Post Note  Patient: Ana Biblelizabeth Lopez  Procedure(s) Performed: Procedure(s) (LRB): TOTAL HIP ARTHROPLASTY ANTERIOR APPROACH (Left)  Patient location during evaluation: PACU Anesthesia Type: Spinal Level of consciousness: awake and alert and oriented Pain management: pain level controlled Vital Signs Assessment: post-procedure vital signs reviewed and stable Respiratory status: spontaneous breathing, nonlabored ventilation and respiratory function stable Cardiovascular status: blood pressure returned to baseline and stable Postop Assessment: no signs of nausea or vomiting, patient able to bend at knees, spinal receding, no headache and no backache Anesthetic complications: no    Last Vitals:  Filed Vitals:   08/10/15 1106 08/10/15 1123  BP: 108/87 108/72  Pulse: 65 64  Temp:    Resp: 24 18    Last Pain:  Filed Vitals:   08/10/15 1132  PainSc: 5                  Yishai Rehfeld A.

## 2015-08-11 ENCOUNTER — Encounter (HOSPITAL_COMMUNITY): Payer: Self-pay | Admitting: Orthopedic Surgery

## 2015-08-11 LAB — CBC
HCT: 32.3 % — ABNORMAL LOW (ref 36.0–46.0)
HEMOGLOBIN: 10 g/dL — AB (ref 12.0–15.0)
MCH: 29.9 pg (ref 26.0–34.0)
MCHC: 31 g/dL (ref 30.0–36.0)
MCV: 96.7 fL (ref 78.0–100.0)
Platelets: 237 10*3/uL (ref 150–400)
RBC: 3.34 MIL/uL — ABNORMAL LOW (ref 3.87–5.11)
RDW: 13.8 % (ref 11.5–15.5)
WBC: 6.1 10*3/uL (ref 4.0–10.5)

## 2015-08-11 LAB — BASIC METABOLIC PANEL
Anion gap: 5 (ref 5–15)
BUN: 16 mg/dL (ref 6–20)
CALCIUM: 8.5 mg/dL — AB (ref 8.9–10.3)
CHLORIDE: 108 mmol/L (ref 101–111)
CO2: 24 mmol/L (ref 22–32)
CREATININE: 0.82 mg/dL (ref 0.44–1.00)
GFR calc non Af Amer: >60 mL/min (ref >60)
Glucose, Bld: 119 mg/dL — ABNORMAL HIGH (ref 65–99)
Potassium: 3.8 mmol/L (ref 3.5–5.1)
SODIUM: 137 mmol/L (ref 135–145)

## 2015-08-11 NOTE — Progress Notes (Signed)
Subjective: Pt stable -pain ok   Objective: Vital signs in last 24 hours: Temp:  [97.3 F (36.3 C)-98.4 F (36.9 C)] 98.2 F (36.8 C) (07/19 0521) Pulse Rate:  [60-68] 68 (07/19 0521) Resp:  [16-24] 16 (07/19 0521) BP: (105-139)/(43-87) 125/57 mmHg (07/19 0521) SpO2:  [99 %-100 %] 100 % (07/19 0521)  Intake/Output from previous day: 07/18 0701 - 07/19 0700 In: 2070 [P.O.:360; I.V.:1550; IV Piggyback:160] Out: 225 [Blood:225] Intake/Output this shift:    Exam:  leg lengths ok df pf ok  Labs:  Recent Labs  08/11/15 0422  HGB 10.0*    Recent Labs  08/11/15 0422  WBC 6.1  RBC 3.34*  HCT 32.3*  PLT 237    Recent Labs  08/11/15 0422  NA 137  K 3.8  CL 108  CO2 24  BUN 16  CREATININE 0.82  GLUCOSE 119*  CALCIUM 8.5*   No results for input(s): LABPT, INR in the last 72 hours.  Assessment/Plan: Plan pt today possible dc fri   DEAN,GREGORY SCOTT 08/11/2015, 6:56 AM

## 2015-08-11 NOTE — Evaluation (Signed)
Occupational Therapy Evaluation and Discharge Patient Details Name: Ana Biblelizabeth Lopez MRN: [id number] DOB: 09/04/1929 Today's Date: 08/11/2015    History of Present Illness Pt is a 82 y/o F s/p Lt THA.  Pt's PMH includes chronic back pain, Lt benign breast mass excision, Rt THA, atrial flutter ablation.   Clinical Impression   Pt was independent prior to admission. Presents with L hip pain with generalized weakness and impaired standing balance. All education completed and pt verbalizing understanding. No further OT needs.    Follow Up Recommendations  No OT follow up    Equipment Recommendations  3 in 1 bedside comode;Hospital bed (youth walker)    Recommendations for Other Services       Precautions / Restrictions Precautions Precautions: Fall Precaution Comments: direct anterior approach, no hip precautions Restrictions Weight Bearing Restrictions: No LLE Weight Bearing: Weight bearing as tolerated      Mobility Bed Mobility               General bed mobility comments: pt in chair  Transfers Overall transfer level: Needs assistance Equipment used: Rolling walker (2 wheeled) Transfers: Sit to/from Stand Sit to Stand: Min guard         General transfer comment: min guard for safety, verbal cues for technique    Balance     Sitting balance-Leahy Scale: Good       Standing balance-Leahy Scale: Fair Standing balance comment: able to release walker in standing with both hands                            ADL Overall ADL's : Needs assistance/impaired Eating/Feeding: Independent;Sitting   Grooming: Wash/dry hands;Standing;Supervision/safety   Upper Body Bathing: Set up;Sitting   Lower Body Bathing: Minimal assistance;Sit to/from stand Lower Body Bathing Details (indicate cue type and reason): instructed in use of long handled sponge Upper Body Dressing : Set up;Sitting   Lower Body Dressing: Minimal assistance;Sit to/from stand Lower  Body Dressing Details (indicate cue type and reason): instructed in use of reacher, long shoe horn, and sock aid, instructed to dress operated leg first and undress last Toilet Transfer: Min Social research officer, governmentguard;RW;Ambulation;BSC Toilet Transfer Details (indicate cue type and reason): instructed in use of 3 in 1 to elevate toilet and as BSC Toileting- Clothing Manipulation and Hygiene: Minimal assistance;Sit to/from Nurse, children'sstand     Tub/Shower Transfer Details (indicate cue type and reason): verbally instructed in technique and in use of 3 in 1 as shower seat, husband will supervise Functional mobility during ADLs: Min guard;Rolling walker;Cueing for sequencing       Vision     Perception     Praxis      Pertinent Vitals/Pain Pain Assessment: 0-10 Pain Score: 4  Pain Location: L hip Pain Descriptors / Indicators: Sore;Guarding;Grimacing Pain Intervention(s): Premedicated before session;Repositioned;Ice applied     Hand Dominance Right   Extremity/Trunk Assessment Upper Extremity Assessment Upper Extremity Assessment: Overall WFL for tasks assessed   Lower Extremity Assessment Lower Extremity Assessment: Defer to PT evaluation       Communication Communication Communication: No difficulties   Cognition Arousal/Alertness: Awake/alert Behavior During Therapy: WFL for tasks assessed/performed Overall Cognitive Status: Within Functional Limits for tasks assessed                     General Comments       Exercises       Shoulder Instructions      Home Living Family/patient expects  to be discharged to:: Private residence Living Arrangements: Spouse/significant other Available Help at Discharge: Family;Available 24 hours/day Type of Home: House Home Access: Stairs to enter [employer] of Steps: 4 Entrance Stairs-Rails: Right Home Layout: Two level;Full bath on main level Alternate Level Stairs-Number of Steps: 14 Alternate Level Stairs-Rails: Left Bathroom  Shower/Tub: Producer, television/film/video: Standard     Home Equipment: Grab bars - tub/shower;Cane - single point;Adaptive equipment Adaptive Equipment: Reacher        Prior Functioning/Environment Level of Independence: Independent             OT Diagnosis: Generalized weakness;Acute pain   OT Problem List:     OT Treatment/Interventions:      OT Goals(Current goals can be found in the care plan section) Acute Rehab OT Goals Patient Stated Goal: decreased pain  OT Frequency:     Barriers to D/C:            Co-evaluation              End of Session Equipment Utilized During Treatment: Gait belt;Rolling walker  Activity Tolerance: Patient tolerated treatment well Patient left: in chair;with call bell/phone within reach;with chair alarm set   Time: 1131-1206 OT Time Calculation (min): 35 min Charges:  OT General Charges $OT Visit: 1 Procedure OT Evaluation $OT Eval Low Complexity: 1 Procedure OT Treatments $Self Care/Home Management : 8-22 mins G-Codes:    Evern Bio 08/11/2015, 12:19 PM  [phone]

## 2015-08-11 NOTE — Progress Notes (Signed)
Physical Therapy Treatment Patient Details Name: Ana Lopez MRN: [id number] DOB: 1929-03-10 Today's Date: 08/11/2015    History of Present Illness Pt is a 82 y/o F s/p Lt THA.  Pt's PMH includes chronic back pain, Lt benign breast mass excision, Rt THA, atrial flutter ablation.    PT Comments    Pt performed increased gait training.  Pt denies nausea. Pt remains to present with weakness and slow to progress through standing exercises.  Will f/u this pm for treatment.    Follow Up Recommendations  Home health PT;Supervision for mobility/OOB     Equipment Recommendations  Rolling walker with 5" wheels;3in1 (PT);Hospital bed (Pt requesting hopsital bed so she can stay downstairs.  )    Recommendations for Other Services       Precautions / Restrictions Precautions Precautions: Fall Precaution Comments: direct anterior approach, no hip precautions Restrictions Weight Bearing Restrictions: No LLE Weight Bearing: Weight bearing as tolerated    Mobility  Bed Mobility               General bed mobility comments: Pt received in recliner chair on arrival.   Transfers Overall transfer level: Needs assistance Equipment used: Rolling walker (2 wheeled) Transfers: Sit to/from Stand Sit to Stand: Min guard Stand pivot transfers: Min guard       General transfer comment: Cues for sequencing and hand placement to push from seated surface,  Cues for LLE advancement and forward weight shifting.  Min guard for safety.    Ambulation/Gait Ambulation/Gait assistance: Min guard Ambulation Distance (Feet): 180 Feet Assistive device: Rolling walker (2 wheeled) Gait Pattern/deviations: Step-through pattern;Antalgic;Trunk flexed;Decreased stance time - left;Decreased step length - right     General Gait Details: Pt required cues for upright posture and RW positioning.  Cues for continous step through pattern.     Stairs            Wheelchair Mobility    Modified  Rankin (Stroke Patients Only)       Balance     Sitting balance-Leahy Scale: Good       Standing balance-Leahy Scale: Fair Standing balance comment: able to release walker in standing with both hands                    Cognition Arousal/Alertness: Awake/alert Behavior During Therapy: WFL for tasks assessed/performed Overall Cognitive Status: Within Functional Limits for tasks assessed                      Exercises Total Joint Exercises Hip ABduction/ADduction: AROM;Left;Standing;10 reps Knee Flexion: AROM;Left;Standing;10 reps Marching in Standing: AROM;10 reps;Standing;Left;AAROM Standing Hip Extension: AROM;10 reps;Standing;Left    General Comments        Pertinent Vitals/Pain Pain Assessment: No/denies pain Pain Score: 6  Pain Location: L hip Pain Descriptors / Indicators: Guarding;Grimacing;Sore;Tightness;Throbbing Pain Intervention(s): Monitored during session;Repositioned    Home Living Family/patient expects to be discharged to:: Private residence Living Arrangements: Spouse/significant other Available Help at Discharge: Family;Available 24 hours/day Type of Home: House Home Access: Stairs to enter Entrance Stairs-Rails: Right Home Layout: Two level;Full bath on main level Home Equipment: Grab bars - tub/shower;Cane - single point;Adaptive equipment      Prior Function Level of Independence: Independent          PT Goals (current goals can now be found in the care plan section) Acute Rehab PT Goals Patient Stated Goal: decreased pain Potential to Achieve Goals: Good Progress towards PT goals: Progressing toward goals  Frequency       PT Plan Current plan remains appropriate    Co-evaluation             End of Session Equipment Utilized During Treatment: Gait belt Activity Tolerance: Patient tolerated treatment well Patient left: in chair;with call bell/phone within reach;with chair alarm set;with family/visitor  present     Time: [id number] PT Time Calculation (min) (ACUTE ONLY): 23 min  Charges:  $Gait Training: 8-22 mins $Therapeutic Exercise: 8-22 mins                    G Codes:      Florestine Aversimee J Carena Stream 08/11/2015, 2:36 PM Joycelyn RuaAimee Orpha Dain, PTA pager [phone]

## 2015-08-11 NOTE — Care Management Note (Signed)
Case Management Note  Patient Details  Name: Ana Lopez MRN: [id number] Date of Birth: Feb 25, 1929  Subjective/Objective:    82 yr old lady, s/p left total hip arthroplasty.                Action/Plan: Case manager spoke with patient concerning Home Health and DME needs. Choice was offered. Patient was preoperatively setup with [facility], no changes. She will have family support at discharge. DME has been ordered.    Expected Discharge Date:    08/12/15              Expected Discharge Plan:  Home w Home Health Services  In-House Referral:     Discharge planning Services  CM Consult  Post Acute Care Choice:  Durable Medical Equipment, Home Health Choice offered to:  Patient  DME Arranged:  3-N-1, Walker youth, Hospital bed DME Agency:  [facility]  HH Arranged:  PT [facility] Agency:  [facility]  Status of Service:  Completed, signed off  If discussed at [employer] Length of Stay Meetings, dates discussed:    Additional Comments:  Durenda GuthrieBrady, Kaytlyn Din Naomi, RN 08/11/2015, 3:44 PM

## 2015-08-11 NOTE — Progress Notes (Signed)
Physical Therapy Treatment Patient Details Name: Ana Lopez MRN: [id number] DOB: 01-Jul-1929 Today's Date: 08/11/2015    History of Present Illness Pt is a 82 y/o F s/p Lt THA.  Pt's PMH includes chronic back pain, Lt benign breast mass excision, Rt THA, atrial flutter ablation.    PT Comments    Pt reports walking with husband this pm.  Reports increased pain in L quad.  PTA performing stretching to L quad with contract relax technique and pt reports decreased pain.  Pt able to perform gait training after stretching.  Pt transferred back to bed and educated on signs of too much.  Will f/u in am to progress mobility.    Follow Up Recommendations  Home health PT;Supervision for mobility/OOB     Equipment Recommendations  Rolling walker with 5" wheels;3in1 (PT);Hospital bed (Pt requesting hospital bed so she can sleep down stairs.  )    Recommendations for Other Services       Precautions / Restrictions Precautions Precautions: Fall Precaution Comments: direct anterior approach, no hip precautions Restrictions Weight Bearing Restrictions: No LLE Weight Bearing: Weight bearing as tolerated    Mobility  Bed Mobility Overal bed mobility: Needs Assistance Bed Mobility: Sit to Supine     Supine to sit: Min assist;HOB elevated     General bed mobility comments: Pt received in recliner chair on arrival. Pt performed back to bed transfer and required min assist to lift B LEs back into bed.   Transfers Overall transfer level: Needs assistance Equipment used: Rolling walker (2 wheeled) Transfers: Sit to/from Stand Sit to Stand: Min guard Stand pivot transfers: Min guard       General transfer comment: Cues for sequencing and hand placement to push from seated surface,  Cues for LLE advancement and forward weight shifting.  Min guard for safety.    Ambulation/Gait Ambulation/Gait assistance: Min guard Ambulation Distance (Feet): 180 Feet Assistive device: Rolling  walker (2 wheeled) Gait Pattern/deviations: Step-through pattern;Antalgic;Trunk flexed     General Gait Details: Pt initially unable to lift LLE forward for step and required cues for increased hip and knee flexion in swing phase.  This improved as gait progressed.  Pt required cues for upright posture and RW positioning.  Cues for continous step through pattern.     Stairs            Wheelchair Mobility    Modified Rankin (Stroke Patients Only)       Balance     Sitting balance-Leahy Scale: Good       Standing balance-Leahy Scale: Fair                      Cognition Arousal/Alertness: Awake/alert Behavior During Therapy: WFL for tasks assessed/performed Overall Cognitive Status: Within Functional Limits for tasks assessed                      Exercises Total Joint Exercises Heel Slides: AROM;Left;10 reps;Supine Hip ABduction/ADduction:  ( ) Knee Flexion:  ( ) Marching in Standing:  ( ) Standing Hip Extension:  ( ) Other Exercises Other Exercises: Performed contract relax tecnhique to L quad x3 reps to improve ROM and decrease pain.  Pt reports decreased pain.      General Comments        Pertinent Vitals/Pain Pain Assessment: No/denies pain Pain Score: 6  Pain Location: L quad Pain Descriptors / Indicators: Guarding;Grimacing;Sore;Tightness Pain Intervention(s): Monitored during session;Repositioned;Ice applied (educated on length of time  for ice pac to be applied.  )    Home Living                      Prior Function            PT Goals (current goals can now be found in the care plan section) Acute Rehab PT Goals Patient Stated Goal: decreased pain Potential to Achieve Goals: Good Progress towards PT goals: Progressing toward goals    Frequency  7X/week    PT Plan Current plan remains appropriate    Co-evaluation             End of Session Equipment Utilized During Treatment: Gait belt Activity Tolerance:  Patient tolerated treatment well Patient left: in chair;with call bell/phone within reach;with chair alarm set;with family/visitor present     Time: 4098-1191 PT Time Calculation (min) (ACUTE ONLY): 26 min  Charges:  $Gait Training: 8-22 mins $Therapeutic Exercise: 8-22 mins                    G Codes:      Florestine Avers Aug 17, 2015, 5:42 PM Joycelyn Rua, PTA pager [phone]

## 2015-08-11 NOTE — Progress Notes (Addendum)
Shift notes: patient is alert, awake, sitting in the recliner, no acute distress noted, no c/o pain or discomfort, dressing to left hip hip clean, dry and intact. Will continue to monitor.

## 2015-08-11 NOTE — Care Management Important Message (Signed)
Important Message  Patient Details  Name: Ana Biblelizabeth Lopez MRN: [id number] Date of Birth: November 02, 1929   Medicare Important Message Given:  Yes    Bernadette HoitShoffner, Neizan Debruhl Coleman 08/11/2015, 10:08 AM

## 2015-08-11 NOTE — Clinical Social Work Note (Signed)
CSW acknowledges SNF consult. PT recommending HHPT.  CSW signing off. Consult if any other social work needs arise.  Charlynn CourtSarah Melodee Lupe, CSW [phone]

## 2015-08-11 NOTE — Discharge Instructions (Signed)
Information on my medicine - XARELTO (Rivaroxaban)  This medication education was reviewed with me or my healthcare representative as part of my discharge preparation.  The pharmacist that spoke with me during my hospital stay was:  Arminger, Jomarie LongsJoseph, [facility]  Why was Xarelto prescribed for you? Xarelto was prescribed for you to reduce the risk of blood clots forming after orthopedic surgery. The medical term for these abnormal blood clots is venous thromboembolism (VTE).  What do you need to know about xarelto ? Take your Xarelto ONCE DAILY at the same time every day. You may take it either with or without food.  If you have difficulty swallowing the tablet whole, you may crush it and mix in applesauce just prior to taking your dose.  Take Xarelto exactly as prescribed by your doctor and DO NOT stop taking Xarelto without talking to the doctor who prescribed the medication.  Stopping without other VTE prevention medication to take the place of Xarelto may increase your risk of developing a clot.  After discharge, you should have regular check-up appointments with your healthcare provider that is prescribing your Xarelto.    What do you do if you miss a dose? If you miss a dose, take it as soon as you remember on the same day then continue your regularly scheduled once daily regimen the next day. Do not take two doses of Xarelto on the same day.   Important Safety Information A possible side effect of Xarelto is bleeding. You should call your healthcare provider right away if you experience any of the following: ? Bleeding from an injury or your nose that does not stop. ? Unusual colored urine (red or dark brown) or unusual colored stools (red or black). ? Unusual bruising for unknown reasons. ? A serious fall or if you hit your head (even if there is no bleeding).  Some medicines may interact with Xarelto and might increase your risk of bleeding while on Xarelto. To help avoid  this, consult your healthcare provider or pharmacist prior to using any new prescription or non-prescription medications, including herbals, vitamins, non-steroidal anti-inflammatory drugs (NSAIDs) and supplements.  This website has more information on Xarelto: [email or web address].xarelto.[email or web address].

## 2015-08-12 NOTE — Progress Notes (Signed)
Physical Therapy Treatment Patient Details Name: Ana Lopez MRN: [id number] DOB: 1930-01-14 Today's Date: 08/12/2015    History of Present Illness Pt is a 82 y/o F s/p Lt THA.  Pt's PMH includes chronic back pain, Lt benign breast mass excision, Rt THA, atrial flutter ablation.    PT Comments    Pt performed increased mobility and progressing to standing exercises this pm.  Pt positioned in bed post treatment to rest after sitting up in recliner all day.    Follow Up Recommendations  Home health PT;Supervision for mobility/OOB     Equipment Recommendations  Rolling walker with 5" wheels;3in1 (PT);Hospital bed (Pt requesting hospital bed to sleep downstairs at home.  )    Recommendations for Other Services       Precautions / Restrictions Precautions Precautions: Fall Precaution Comments: direct anterior approach, no hip precautions Restrictions Weight Bearing Restrictions: No LLE Weight Bearing: Weight bearing as tolerated    Mobility  Bed Mobility Overal bed mobility: Needs Assistance Bed Mobility: Sit to Supine       Sit to supine: Supervision   General bed mobility comments: Pt used strap to lift LLE into bed without assist.  Required increased time secondary to pain and guarding.    Transfers Overall transfer level: Needs assistance Equipment used: Rolling walker (2 wheeled) Transfers: Sit to/from Stand Sit to Stand: Supervision Stand pivot transfers: Supervision       General transfer comment: Cues for hand placement and LLE forward.  Pt required increased time.  Performed transfer from recliner and 3:1.  Cues to keep RW close during transfers.  Pt required cues for forward weight shifting to improve balance.    Ambulation/Gait Ambulation/Gait assistance: Supervision Ambulation Distance (Feet): 200 Feet Assistive device: Rolling walker (2 wheeled) Gait Pattern/deviations: Step-through pattern;Antalgic;Trunk flexed     General Gait Details: Pt  remains to require cues for upper trunk control.  Pt required cues for gait symmetry.     Stairs            Wheelchair Mobility    Modified Rankin (Stroke Patients Only)       Balance                                    Cognition Arousal/Alertness: Awake/alert Behavior During Therapy: WFL for tasks assessed/performed Overall Cognitive Status: Within Functional Limits for tasks assessed                      Exercises Total Joint Exercises Ankle Circles/Pumps: Supine Hip ABduction/ADduction: AROM;Left;10 reps;Standing Knee Flexion: AROM;10 reps;Left;Standing Marching in Standing: AROM;Left;10 reps;Standing Standing Hip Extension: AROM;Left;10 reps;Standing    General Comments        Pertinent Vitals/Pain Pain Assessment: 0-10 Pain Score: 6  Pain Location: L quad and incision Pain Descriptors / Indicators: Grimacing;Guarding;Operative site guarding;Sore;Tightness Pain Intervention(s): Monitored during session;Repositioned    Home Living Family/patient expects to be discharged to:: Private residence Living Arrangements: Spouse/significant other                  Prior Function            PT Goals (current goals can now be found in the care plan section) Acute Rehab PT Goals Patient Stated Goal: decreased pain Potential to Achieve Goals: Good Progress towards PT goals: Progressing toward goals    Frequency  7X/week    PT Plan Current plan remains appropriate  Co-evaluation             End of Session Equipment Utilized During Treatment: Gait belt Activity Tolerance: Patient tolerated treatment well Patient left: in chair;with call bell/phone within reach;with chair alarm set;with family/visitor present     Time: 1610-9604 PT Time Calculation (min) (ACUTE ONLY): 21 min  Charges:  $Gait Training: 8-22 mins                    G Codes:      Florestine Avers 09/11/2015, 2:47 PM  Joycelyn Rua, PTA pager  [phone]

## 2015-08-12 NOTE — Progress Notes (Signed)
   08/12/15 1020  Clinical Encounter Type  Visited With Patient;Family  Visit Type Initial  On morning rounds Chaplain visited patient. Chaplain and patient engaged in a lovely conversation,  Chaplain made further support available if needed.

## 2015-08-12 NOTE — Progress Notes (Signed)
Physical Therapy Treatment Patient Details Name: Ana Biblelizabeth Lopez MRN: [id number] DOB: Apr 26, 1929 Today's Date: 08/12/2015    History of Present Illness Pt is a 82 y/o F s/p Lt THA.  Pt's PMH includes chronic back pain, Lt benign breast mass excision, Rt THA, atrial flutter ablation.    PT Comments    Pt performed stair training and reports decreased pain from previous session.  PTA educated patient on signs of too much.  PTA also educated patient on time to keep ice pack on for optimal results from cold therapy.    Follow Up Recommendations  Home health PT;Supervision for mobility/OOB     Equipment Recommendations  Rolling walker with 5" wheels;3in1 (PT);Hospital bed (Pt requesting hopsital bed so she can sleep at home.  )    Recommendations for Other Services       Precautions / Restrictions Precautions Precautions: Fall Precaution Comments: direct anterior approach, no hip precautions Restrictions Weight Bearing Restrictions: No LLE Weight Bearing: Weight bearing as tolerated    Mobility  Bed Mobility               General bed mobility comments: Pt recieved in recliner on arrival.    Transfers Overall transfer level: Needs assistance Equipment used: Rolling walker (2 wheeled) Transfers: Sit to/from Stand Sit to Stand: Supervision Stand pivot transfers: Supervision       General transfer comment: Cues for hand placement and LLE forward.  Pt required increased time.  Performed transfer from recliner and 3:1.  Cues to keep RW close during transfers.    Ambulation/Gait Ambulation/Gait assistance: Supervision Ambulation Distance (Feet): 120 Feet (decreased gait to allow for stair training this am.  ) Assistive device: Rolling walker (2 wheeled) Gait Pattern/deviations: Step-through pattern;Decreased stride length;Trunk flexed;Antalgic     General Gait Details: Pt remains to require cues for upper trunk control.  Pt able to advance LLE better from previous  session.     Stairs Stairs: Yes   Stair Management: One rail Right;Backwards;Forwards;Step to pattern Number of Stairs: 4 General stair comments: Cues for sequencing and hand placement on rail.  pt performed forward negotiation to ascend and backward negotiation to descend.    Wheelchair Mobility    Modified Rankin (Stroke Patients Only)       Balance Overall balance assessment: Needs assistance   Sitting balance-Leahy Scale: Good       Standing balance-Leahy Scale: Fair                      Cognition Arousal/Alertness: Awake/alert Behavior During Therapy: WFL for tasks assessed/performed Overall Cognitive Status: Within Functional Limits for tasks assessed                      Exercises Total Joint Exercises Ankle Circles/Pumps: AROM;Both;10 reps;Supine Quad Sets: AROM;Left;10 reps;Supine Short Arc Quad: AROM;Left;10 reps;Supine Heel Slides: AROM;AAROM;Left;10 reps;Supine Hip ABduction/ADduction: AROM;Left;AAROM;10 reps;Supine Long Arc Quad: AROM;Left;10 reps;Seated    General Comments        Pertinent Vitals/Pain Pain Assessment: 0-10 Pain Score: 6  Pain Location: L quad and incision.   Pain Descriptors / Indicators: Grimacing;Guarding;Operative site guarding;Sore;Tightness Pain Intervention(s): Monitored during session;Repositioned;Ice applied    Home Living                      Prior Function            PT Goals (current goals can now be found in the care plan section) Acute Rehab PT  Goals Patient Stated Goal: decreased pain Potential to Achieve Goals: Good Progress towards PT goals: Progressing toward goals    Frequency  7X/week    PT Plan Current plan remains appropriate    Co-evaluation             End of Session Equipment Utilized During Treatment: Gait belt Activity Tolerance: Patient tolerated treatment well Patient left: in chair;with call bell/phone within reach;with chair alarm set;with  family/visitor present     Time: 5366-4403 PT Time Calculation (min) (ACUTE ONLY): 25 min  Charges:  $Gait Training: 8-22 mins $Therapeutic Exercise: 8-22 mins                    G Codes:      Ana Lopez 2015/08/24, 10:11 AM Ana Lopez, PTA pager [phone]

## 2015-08-13 MED ORDER — RIVAROXABAN 10 MG PO TABS: 10.0000 mg | ORAL_TABLET | Freq: Every day | ORAL | Status: AC

## 2015-08-13 MED ORDER — METHOCARBAMOL 500 MG PO TABS: 500.0000 mg | ORAL_TABLET | Freq: Four times a day (QID) | ORAL | Status: AC | PRN

## 2015-08-13 MED ORDER — HYDROCODONE-ACETAMINOPHEN 5-325 MG PO TABS: 1.0000 | ORAL_TABLET | ORAL | Status: AC | PRN

## 2015-08-13 NOTE — Care Management Important Message (Signed)
Important Message  Patient Details  Name: Ana Biblelizabeth Lopez MRN: [id number] Date of Birth: 1929/04/27   Medicare Important Message Given:  Yes    Bernadette HoitShoffner, Shandy Checo Coleman 08/13/2015, 8:25 AM

## 2015-08-13 NOTE — Progress Notes (Signed)
Pt stable lle plan dc today

## 2015-08-13 NOTE — Progress Notes (Addendum)
Pt d/c'd via wheelchair with belongings, with equipment :walker &3 in 1,  with husband, escorted by unit NTs.

## 2015-08-13 NOTE — Progress Notes (Signed)
Physical Therapy Treatment Patient Details Name: Ana Lopez MRN: [id number] DOB: 03-25-1929 Today's Date: 08/13/2015    History of Present Illness Pt is a 82 y/o F s/p Lt THA.  Pt's PMH includes chronic back pain, Lt benign breast mass excision, Rt THA, atrial flutter ablation.    PT Comments    Pt ready for d/c home.  Reviewed stairs and completed HEP with spouse present.  Pt tolerated tx well.    Follow Up Recommendations  Home health PT;Supervision for mobility/OOB     Equipment Recommendations  Rolling walker with 5" wheels;3in1 (PT);Hospital bed (wants hospital bed to sleep downstairs at home)    Recommendations for Other Services       Precautions / Restrictions Precautions Precautions: Fall Precaution Comments: direct anterior approach, no hip precautions Restrictions Weight Bearing Restrictions: No LLE Weight Bearing: Weight bearing as tolerated    Mobility  Bed Mobility Overal bed mobility: Needs Assistance Bed Mobility: Sit to Supine     Supine to sit: HOB elevated;Supervision     General bed mobility comments: Pt used strap to lift LLE out of bed without assist.  Required increased time secondary to pain and guarding.    Transfers Overall transfer level: Needs assistance Equipment used: Rolling walker (2 wheeled) Transfers: Sit to/from Stand Sit to Stand: Supervision Stand pivot transfers: Supervision       General transfer comment: Max VCs for forward weight shifting and advancement of LLE.    Ambulation/Gait Ambulation/Gait assistance: Supervision Ambulation Distance (Feet): 300 Feet Assistive device: Rolling walker (2 wheeled) Gait Pattern/deviations: Step-through pattern;Antalgic;Trunk flexed     General Gait Details: Pt remains to require cues for upper trunk control.  Pt required cues for gait symmetry.     Stairs Stairs: Yes Stairs assistance: Supervision Stair Management: One rail Right;Forwards;Backwards;Step to  pattern Number of Stairs: 4 General stair comments: Cues for sequencing and hand placement on rail.  pt performed forward negotiation to ascend and backward negotiation to descend.    Wheelchair Mobility    Modified Rankin (Stroke Patients Only)       Balance     Sitting balance-Leahy Scale: Normal       Standing balance-Leahy Scale: Fair                      Cognition Arousal/Alertness: Awake/alert Behavior During Therapy: WFL for tasks assessed/performed Overall Cognitive Status: Within Functional Limits for tasks assessed                      Exercises Total Joint Exercises Ankle Circles/Pumps: AROM;Both;10 reps;Supine Quad Sets: AROM;Left;10 reps;Supine Short Arc Quad: AROM;Left;10 reps;Supine Heel Slides: AROM;Left;10 reps;Supine Hip ABduction/ADduction: AAROM;Left;Supine;AROM;20 reps;Standing (with strap in supine and AROM with standing.  1x10 in each position.  ) Long Arc Quad: AROM;Left;10 reps;Seated Knee Flexion: AROM;10 reps;Left;Standing Marching in Standing: AROM;Left;10 reps;Standing Standing Hip Extension: AROM;Left;10 reps;Standing    General Comments        Pertinent Vitals/Pain Pain Assessment: 0-10 Pain Score: 4  Pain Location: L quad Pain Descriptors / Indicators: Guarding;Grimacing Pain Intervention(s): Monitored during session;Repositioned;Ice applied    Home Living                      Prior Function            PT Goals (current goals can now be found in the care plan section) Acute Rehab PT Goals Patient Stated Goal: decreased pain Potential to Achieve Goals: Good Progress  towards PT goals: Progressing toward goals    Frequency  7X/week    PT Plan Current plan remains appropriate    Co-evaluation             End of Session Equipment Utilized During Treatment: Gait belt Activity Tolerance: Patient tolerated treatment well Patient left: in chair;with call bell/phone within reach;with chair  alarm set;with family/visitor present     Time: 0454-0981 PT Time Calculation (min) (ACUTE ONLY): 21 min  Charges:  $Gait Training: 8-22 mins                    G Codes:      Florestine Avers August 27, 2015, 10:25 AM  Joycelyn Rua, PTA pager [phone]

## 2015-08-13 NOTE — Progress Notes (Signed)
D/c instructions reviewed with pt and her husband. Copy of instructions and scripts given to pt/husband.

## 2015-08-24 NOTE — Discharge Summary (Signed)
Physician Discharge Summary  Patient ID: Ana Lopez MRN: [id number] DOB/AGE: 80-12-1929 80 y.o.  Admit date: 08/10/2015 Discharge date: 08/13/2015  Admission Diagnoses:  Active Problems:   Hip arthritis   Discharge Diagnoses:  Same  Surgeries: Procedure(s): TOTAL HIP ARTHROPLASTY ANTERIOR APPROACH on 08/10/2015   Consultants:   Discharged Condition: Stable  Hospital Course: Ana Lopez is an 82 y.o. female who was admitted 08/10/2015 with a chief complaint of hip pain, and found to have a diagnosis of hip arthritis.  They were brought to the operating room on 08/10/2015 and underwent the above named procedures.  She tolerated procedure well and was seen by therapy on postop day #1.  Fibrillation started at that time.  By postop day #2 she was ambulating well in the room.  Discharged home on postoperative day #3 in good condition.  Incision intact at that time.   Antibiotics given:  Anti-infectives    Start     Dose/Rate Route Frequency Ordered Stop   08/10/15 1400  ceFAZolin (ANCEF) IVPB 1 g/50 mL premix     1 g 100 mL/hr over 30 Minutes Intravenous Every 6 hours 08/10/15 1142 08/10/15 2107   08/10/15 0613  ceFAZolin (ANCEF) 2-4 GM/100ML-% IVPB    Comments:  Dia Crawford   : cabinet override      08/10/15 0613 08/10/15 1829   08/10/15 0612  ceFAZolin (ANCEF) IVPB 2g/100 mL premix     2 g 200 mL/hr over 30 Minutes Intravenous On call to O.R. 08/10/15 4098 08/10/15 0747    .  Recent vital signs:  Vitals:   08/12/15 2018 08/13/15 0431  BP: (!) 131/52 (!) 101/53  Pulse: 84 74  Resp: 18 16  Temp: 98.6 F (37 C) 98.7 F (37.1 C)    Recent laboratory studies:  Results for orders placed or performed during the hospital encounter of 08/10/15  CBC  Result Value Ref Range   WBC 6.1 4.0 - 10.5 K/uL   RBC 3.34 (L) 3.87 - 5.11 MIL/uL   Hemoglobin 10.0 (L) 12.0 - 15.0 g/dL   HCT 11.9 (L) 14.7 - 82.9 %   MCV 96.7 78.0 - 100.0 fL   MCH 29.9 26.0 - 34.0 pg   MCHC  31.0 30.0 - 36.0 g/dL   RDW 56.2 13.0 - 86.5 %   Platelets 237 150 - 400 K/uL  Basic metabolic panel  Result Value Ref Range   Sodium 137 135 - 145 mmol/L   Potassium 3.8 3.5 - 5.1 mmol/L   Chloride 108 101 - 111 mmol/L   CO2 24 22 - 32 mmol/L   Glucose, Bld 119 (H) 65 - 99 mg/dL   BUN 16 6 - 20 mg/dL   Creatinine, Ser 7.84 0.44 - 1.00 mg/dL   Calcium 8.5 (L) 8.9 - 10.3 mg/dL   GFR calc non Af Amer >60 >60 mL/min   GFR calc Af Amer >60 >60 mL/min   Anion gap 5 5 - 15    Discharge Medications:     Medication List    STOP taking these medications   aspirin 81 MG tablet     TAKE these medications   cholecalciferol 1000 units tablet Commonly known as:  VITAMIN D Take 2,000 Units by mouth daily.   fenofibrate micronized 134 MG capsule Commonly known as:  LOFIBRA Take 1 capsule by mouth daily.   HYDROcodone-acetaminophen 5-325 MG tablet Commonly known as:  NORCO/VICODIN Take 1-2 tablets by mouth every 4 (four) hours as needed (breakthrough pain). What changed:  how much to take  when to take this  reasons to take this   methocarbamol 500 MG tablet Commonly known as:  ROBAXIN Take 1 tablet (500 mg total) by mouth every 6 (six) hours as needed for muscle spasms.   raloxifene 60 MG tablet Commonly known as:  EVISTA Take 1 tablet by mouth daily.   rivaroxaban 10 MG Tabs tablet Commonly known as:  XARELTO Take 1 tablet (10 mg total) by mouth daily with breakfast.   simvastatin 40 MG tablet Commonly known as:  ZOCOR Take 1 tablet by mouth every evening.       Diagnostic Studies: Dg Chest 2 View  Result Date: 07/30/2015 CLINICAL DATA:  Preop total hip arthroplasty EXAM: CHEST  2 VIEW COMPARISON:  None. FINDINGS: 6 mm calcified granuloma in the left upper lobe. Lungs are otherwise clear. No pleural effusion or pneumothorax. The heart is normal in size. Visualized osseous structures are within normal limits, noting thoracic dextroscoliosis. IMPRESSION: No evidence  of acute cardiopulmonary disease. Electronically Signed   By: Charline Bills M.D.   On: 07/30/2015 17:07   Dg C-arm Gt 120 Min  Result Date: 08/10/2015 CLINICAL DATA:  Patient status post left hip arthroplasty. Intraoperative evaluation. EXAM: OPERATIVE LEFT HIP (WITH PELVIS IF PERFORMED) 4 VIEWS TECHNIQUE: Fluoroscopic spot image(s) were submitted for interpretation post-operatively. COMPARISON:  Hip radiograph 05/20/2015 FINDINGS: 4 intraoperative fluoroscopic images were submitted. Patient status post left hip arthroplasty. Hardware appears in appropriate position. No acute osseous abnormality. IMPRESSION: Patient status post left hip arthroplasty. Electronically Signed   By: Annia Belt M.D.   On: 08/10/2015 10:53   Dg Hip Operative Unilat With Pelvis Left  Result Date: 08/10/2015 CLINICAL DATA:  Patient status post left hip arthroplasty. Intraoperative evaluation. EXAM: OPERATIVE LEFT HIP (WITH PELVIS IF PERFORMED) 4 VIEWS TECHNIQUE: Fluoroscopic spot image(s) were submitted for interpretation post-operatively. COMPARISON:  Hip radiograph 05/20/2015 FINDINGS: 4 intraoperative fluoroscopic images were submitted. Patient status post left hip arthroplasty. Hardware appears in appropriate position. No acute osseous abnormality. IMPRESSION: Patient status post left hip arthroplasty. Electronically Signed   By: Annia Belt M.D.   On: 08/10/2015 10:53    Disposition: 01-Home or Self Care  Discharge Instructions    Call MD / Call 911    Complete by:  As directed   If you experience chest pain or shortness of breath, CALL 911 and be transported to the hospital emergency room.  If you develope a fever above 101 F, pus (white drainage) or increased drainage or redness at the wound, or calf pain, call your surgeon's office.   Constipation Prevention    Complete by:  As directed   Drink plenty of fluids.  Prune juice may be helpful.  You may use a stool softener, such as Colace (over the counter) 100 mg  twice a day.  Use MiraLax (over the counter) for constipation as needed.   Diet - low sodium heart healthy    Complete by:  As directed   Discharge instructions    Complete by:  As directed   Discontinue aspirin - you are taking a blood thinner INSTRUCTIONS AFTER JOINT REPLACEMENT   Remove items at home which could result in a fall. This includes throw rugs or furniture in walking pathways ICE to the affected joint every three hours while awake for 30 minutes at a time, for at least the first 3-5 days, and then as needed for pain and swelling.  Continue to use ice for pain and swelling.  You may notice swelling that will progress down to the foot and ankle.  This is normal after surgery.  Elevate your leg when you are not up walking on it.   Continue to use the breathing machine you got in the hospital (incentive spirometer) which will help keep your temperature down.  It is common for your temperature to cycle up and down following surgery, especially at night when you are not up moving around and exerting yourself.  The breathing machine keeps your lungs expanded and your temperature down.   DIET:  As you were doing prior to hospitalization, we recommend a well-balanced diet.  DRESSING / WOUND CARE / SHOWERING  Keep the surgical dressing until follow up.  The dressing is water proof, so you can shower without any extra covering.  IF THE DRESSING FALLS OFF or the wound gets wet inside, change the dressing with sterile gauze.  Please use good hand washing techniques before changing the dressing.  Do not use any lotions or creams on the incision until instructed by your surgeon.    ACTIVITY  Increase activity slowly as tolerated, but follow the weight bearing instructions below.   No driving for 6 weeks or until further direction given by your physician.  You cannot drive while taking narcotics.  No lifting or carrying greater than 10 lbs. until further directed by your surgeon. Avoid periods of  inactivity such as sitting longer than an hour when not asleep. This helps prevent blood clots.  You may return to work once you are authorized by your doctor.     WEIGHT BEARING   Weight bearing as tolerated with assist device (walker, cane, etc) as directed, use it as long as suggested by your surgeon or therapist, typically at least 4-6 weeks.   EXERCISES  Results after joint replacement surgery are often greatly improved when you follow the exercise, range of motion and muscle strengthening exercises prescribed by your doctor. Safety measures are also important to protect the joint from further injury. Any time any of these exercises cause you to have increased pain or swelling, decrease what you are doing until you are comfortable again and then slowly increase them. If you have problems or questions, call your caregiver or physical therapist for advice.   Rehabilitation is important following a joint replacement. After just a few days of immobilization, the muscles of the leg can become weakened and shrink (atrophy).  These exercises are designed to build up the tone and strength of the thigh and leg muscles and to improve motion. Often times heat used for twenty to thirty minutes before working out will loosen up your tissues and help with improving the range of motion but do not use heat for the first two weeks following surgery (sometimes heat can increase post-operative swelling).   These exercises can be done on a training (exercise) mat, on the floor, on a table or on a bed. Use whatever works the best and is most comfortable for you.    Use music or television while you are exercising so that the exercises are a pleasant break in your day. This will make your life better with the exercises acting as a break in your routine that you can look forward to.   Perform all exercises about fifteen times, three times per day or as directed.  You should exercise both the operative leg and the  other leg as well.   Exercises include:   Quad Sets - Tighten up the  muscle on the front of the thigh (Quad) and hold for 5-10 seconds.   Straight Leg Raises - With your knee straight (if you were given a brace, keep it on), lift the leg to 60 degrees, hold for 3 seconds, and slowly lower the leg.  Perform this exercise against resistance later as your leg gets stronger.  Leg Slides: Lying on your back, slowly slide your foot toward your buttocks, bending your knee up off the floor (only go as far as is comfortable). Then slowly slide your foot back down until your leg is flat on the floor again.  Angel Wings: Lying on your back spread your legs to the side as far apart as you can without causing discomfort.  Hamstring Strength:  Lying on your back, push your heel against the floor with your leg straight by tightening up the muscles of your buttocks.  Repeat, but this time bend your knee to a comfortable angle, and push your heel against the floor.  You may put a pillow under the heel to make it more comfortable if necessary.   A rehabilitation program following joint replacement surgery can speed recovery and prevent re-injury in the future due to weakened muscles. Contact your doctor or a physical therapist for more information on knee rehabilitation.    CONSTIPATION  Constipation is defined medically as fewer than three stools per week and severe constipation as less than one stool per week.  Even if you have a regular bowel pattern at home, your normal regimen is likely to be disrupted due to multiple reasons following surgery.  Combination of anesthesia, postoperative narcotics, change in appetite and fluid intake all can affect your bowels.   YOU MUST use at least one of the following options; they are listed in order of increasing strength to get the job done.  They are all available over the counter, and you may need to use some, POSSIBLY even all of these options:    Drink plenty of  fluids (prune juice may be helpful) and high fiber foods Colace 100 mg by mouth twice a day  Senokot for constipation as directed and as needed Dulcolax (bisacodyl), take with full glass of water  Miralax (polyethylene glycol) once or twice a day as needed.  If you have tried all these things and are unable to have a bowel movement in the first 3-4 days after surgery call either your surgeon or your primary doctor.    If you experience loose stools or diarrhea, hold the medications until you stool forms back up.  If your symptoms do not get better within 1 week or if they get worse, check with your doctor.  If you experience "the worst abdominal pain ever" or develop nausea or vomiting, please contact the office immediately for further recommendations for treatment.   ITCHING:  If you experience itching with your medications, try taking only a single pain pill, or even half a pain pill at a time.  You can also use Benadryl over the counter for itching or also to help with sleep.   TED HOSE STOCKINGS:  Use stockings on both legs until for at least 2 weeks or as directed by physician office. They may be removed at night for sleeping.  MEDICATIONS:  See your medication summary on the "After Visit Summary" that nursing will review with you.  You may have some home medications which will be placed on hold until you complete the course of blood thinner medication.  It is  important for you to complete the blood thinner medication as prescribed.  PRECAUTIONS:  If you experience chest pain or shortness of breath - call 911 immediately for transfer to the hospital emergency department.   If you develop a fever greater that 101 F, purulent drainage from wound, increased redness or drainage from wound, foul odor from the wound/dressing, or calf pain - CONTACT YOUR SURGEON.                                                   FOLLOW-UP APPOINTMENTS:  If you do not already have a post-op appointment, please call  the office for an appointment to be seen by your surgeon.  Guidelines for how soon to be seen are listed in your "After Visit Summary", but are typically between 1-4 weeks after surgery.  OTHER INSTRUCTIONS:   Knee Replacement:  Do not place pillow under knee, focus on keeping the knee straight while resting. CPM instructions: 0-90 degrees, 2 hours in the morning, 2 hours in the afternoon, and 2 hours in the evening. Place foam block, curve side up under heel at all times except when in CPM or when walking.  DO NOT modify, tear, cut, or change the foam block in any way.  MAKE SURE YOU:  Understand these instructions.  Get help right away if you are not doing well or get worse.    Thank you for letting us be a part of your medical care team.  It is a privilege we respect greatly.  We hope these instructions will help you stay on track for a fast and full recovery!   Increase activity slowly as tolerated    Complete by:  As directed      Follow-up Information    [facility].   Why:  Someone from [facility]), will contact you to arrange start date and time for Home Health Physical Therapy Contact information: [address] [place] [address] [phone]            Signed: Cammy Copa 08/24/2015, 4:58 PM

## 2016-07-05 ENCOUNTER — Other Ambulatory Visit: Payer: Self-pay | Admitting: Internal Medicine

## 2016-07-05 DIAGNOSIS — R413 Other amnesia: Secondary | ICD-10-CM

## 2016-07-05 DIAGNOSIS — R42 Dizziness and giddiness: Secondary | ICD-10-CM

## 2016-07-21 ENCOUNTER — Ambulatory Visit
Admission: RE | Admit: 2016-07-21 | Discharge: 2016-07-21 | Disposition: A | Discharge status: Home / Self Care | Payer: Medicare Other | Source: Ambulatory Visit | Attending: Internal Medicine | Admitting: Internal Medicine

## 2016-07-21 DIAGNOSIS — R413 Other amnesia: Secondary | ICD-10-CM

## 2016-07-21 DIAGNOSIS — R42 Dizziness and giddiness: Secondary | ICD-10-CM

## 2016-08-22 ENCOUNTER — Other Ambulatory Visit: Payer: Medicare Other

## 2022-12-24 DEATH — deceased
